# Patient Record
Sex: Female | Born: 2000
Health system: Southern US, Community
[De-identification: ages and names within clinical notes are randomized; demographics above are authoritative.]

## PROBLEM LIST (undated history)

## (undated) DIAGNOSIS — T7840XA Allergy, unspecified, initial encounter: Secondary | ICD-10-CM

## (undated) DIAGNOSIS — R7303 Prediabetes: Secondary | ICD-10-CM

## (undated) DIAGNOSIS — J45909 Unspecified asthma, uncomplicated: Secondary | ICD-10-CM

## (undated) DIAGNOSIS — E559 Vitamin D deficiency, unspecified: Secondary | ICD-10-CM

## (undated) DIAGNOSIS — E669 Obesity, unspecified: Secondary | ICD-10-CM

## (undated) DIAGNOSIS — R569 Unspecified convulsions: Secondary | ICD-10-CM

## (undated) HISTORY — DX: Unspecified asthma, uncomplicated: J45.909

## (undated) HISTORY — DX: Obesity, unspecified: E66.9

## (undated) HISTORY — DX: Allergy, unspecified, initial encounter: T78.40XA

## (undated) HISTORY — PX: TYMPANOSTOMY TUBE PLACEMENT: SHX32

## (undated) HISTORY — DX: Vitamin D deficiency, unspecified: E55.9

## (undated) HISTORY — PX: TONSILLECTOMY: SUR1361

## (undated) HISTORY — PX: ADENOIDECTOMY: SUR15

## (undated) HISTORY — DX: Unspecified convulsions: R56.9

## (undated) HISTORY — DX: Prediabetes: R73.03

---
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
## 2018-01-17 ENCOUNTER — Other Ambulatory Visit: Payer: Self-pay | Admitting: Internal Medicine

## 2018-01-17 ENCOUNTER — Encounter: Payer: BLUE CROSS/BLUE SHIELD | Admitting: Internal Medicine

## 2018-01-17 ENCOUNTER — Telehealth: Payer: Self-pay | Admitting: Internal Medicine

## 2018-01-17 MED ORDER — LISINOPRIL 10 MG PO TABS: 10.0000 mg | ORAL_TABLET | Freq: Every day | ORAL | 2 refills | Status: DC

## 2018-01-17 NOTE — Telephone Encounter (Signed)
Copied from CRM [id number]. Topic: Quick Communication - Rx Refill/Question >> Jan 17, 2018  8:49 AM Luanna Coleawoud, Jessica L wrote: Medication:lisinopril (PRINIVIL,ZESTRIL) 10 MG tablet   Has the patient contacted their pharmacy?yes  Preferred Pharmacy (with phone number or street name):  [facility] [place] [address] [phone] (Phone) [phone] (Fax)  Agent: Please be advised that RX refills may take up to 3 business days. We ask that you follow-up with your pharmacy.

## 2018-02-14 ENCOUNTER — Telehealth: Payer: Self-pay | Admitting: Internal Medicine

## 2018-02-14 NOTE — Telephone Encounter (Signed)
Pt grandmother Kennon RoundsSally called about pt wanting to change rooms at college due to some issues with roommate. Pt is getting stress out due to this. Pt was advised that if she has a doctors note pt will be able to get her room moved faster. Call grandmother at [phone]. Thank you!

## 2018-02-14 NOTE — Telephone Encounter (Signed)
Unable to provide a medical letter secondary to this not being related to a medical condition.

## 2018-02-14 NOTE — Telephone Encounter (Signed)
Is this a letter we can provide for her?

## 2018-02-16 NOTE — Telephone Encounter (Signed)
Patients grandmother is aware.

## 2018-02-25 ENCOUNTER — Other Ambulatory Visit: Payer: Self-pay | Admitting: Internal Medicine

## 2018-02-25 DIAGNOSIS — N911 Secondary amenorrhea: Secondary | ICD-10-CM

## 2018-02-25 DIAGNOSIS — E282 Polycystic ovarian syndrome: Secondary | ICD-10-CM

## 2018-03-28 ENCOUNTER — Ambulatory Visit: Payer: BLUE CROSS/BLUE SHIELD | Admitting: Internal Medicine

## 2018-03-29 ENCOUNTER — Ambulatory Visit: Payer: BLUE CROSS/BLUE SHIELD | Admitting: Internal Medicine

## 2018-03-29 ENCOUNTER — Encounter: Payer: Self-pay | Admitting: Internal Medicine

## 2018-03-29 VITALS — BP 128/82 | HR 100 | Temp 98.6°F | Resp 15 | Ht 65.0 in | Wt 269.2 lb

## 2018-03-29 DIAGNOSIS — Z6841 Body Mass Index (BMI) 40.0 and over, adult: Secondary | ICD-10-CM | POA: Diagnosis not present

## 2018-03-29 DIAGNOSIS — Z8619 Personal history of other infectious and parasitic diseases: Secondary | ICD-10-CM | POA: Diagnosis not present

## 2018-03-29 DIAGNOSIS — I10 Essential (primary) hypertension: Secondary | ICD-10-CM | POA: Diagnosis not present

## 2018-03-29 DIAGNOSIS — E282 Polycystic ovarian syndrome: Secondary | ICD-10-CM

## 2018-03-29 NOTE — Progress Notes (Signed)
Patient ID: Alison Dixon, female   DOB: 07-Oct-2000, 17 y.o.   MRN: [id number]   Subjective:    Patient ID: Alison Dixon, female    DOB: Apr 07, 2001, 26 y.o.   MRN: [id number]  HPI  Patient here for a scheduled follow up.  She is accompanied by her grandmother.  History obtained from both of them.  She reports she is doing better. Feels better.  No chest pain.  No sob.  No acid reflux. No abdominal pain. Energy better.  Handling stress.  Taking her blood pressure medication.  Blood pressure doing better.     Past Medical History:  Diagnosis Date  . Allergy   . Asthma   . Obesity   . Prediabetes   . Seizures (HCC)   . Vitamin D deficiency    Past Surgical History:  Procedure Laterality Date  . ADENOIDECTOMY    . TONSILLECTOMY    . TYMPANOSTOMY TUBE PLACEMENT    . WISDOM TOOTH EXTRACTION  2017   Family History  Problem Relation Age of Onset  . Hypertension Mother   . Hyperlipidemia Mother   . Diabetes Maternal Grandmother   . Hypertension Maternal Grandmother   . Hypertension Maternal Grandfather   . Heart disease Maternal Grandfather   . Heart disease Paternal Grandfather   . Hyperlipidemia Paternal Grandfather    Social History   Socioeconomic History  . Marital status: Single    Spouse name: Not on file  . Number of children: Not on file  . Years of education: Not on file  . Highest education level: Not on file  Occupational History  . Not on file  Social Needs  . Financial resource strain: Not on file  . Food insecurity:    Worry: Not on file    Inability: Not on file  . Transportation needs:    Medical: Not on file    Non-medical: Not on file  Tobacco Use  . Smoking status: Never Smoker  . Smokeless tobacco: Never Used  Substance and Sexual Activity  . Alcohol use: No    Alcohol/week: 0.0 standard drinks  . Drug use: No  . Sexual activity: Not on file  Lifestyle  . Physical activity:    Days per week: Not on file    Minutes per session: Not on  file  . Stress: Not on file  Relationships  . Social connections:    Talks on phone: Not on file    Gets together: Not on file    Attends religious service: Not on file    Active member of club or organization: Not on file    Attends meetings of clubs or organizations: Not on file    Relationship status: Not on file  Other Topics Concern  . Not on file  Social History Narrative  . Not on file    Outpatient Encounter Medications as of 03/29/2018  Medication Sig  . BLISOVI FE 1.5/30 1.5-30 MG-MCG tablet TAKE 1 TABLET DAILY  . lisinopril (PRINIVIL,ZESTRIL) 10 MG tablet TAKE 1 TABLET BY MOUTH DAILY  . lisinopril (PRINIVIL,ZESTRIL) 10 MG tablet Take 1 tablet (10 mg total) by mouth daily.  . Cholecalciferol (VITAMIN D3) 3000 UNITS TABS Take by mouth.   No facility-administered encounter medications on file as of 03/29/2018.     Review of Systems  Constitutional: Negative for appetite change and unexpected weight change.  HENT: Negative for congestion and sinus pressure.   Respiratory: Negative for cough, chest tightness and shortness of breath.  Cardiovascular: Negative for chest pain, palpitations and leg swelling.  Gastrointestinal: Negative for abdominal pain, diarrhea, nausea and vomiting.  Genitourinary: Negative for difficulty urinating and dysuria.  Musculoskeletal: Negative for joint swelling and myalgias.  Skin: Negative for color change and rash.  Neurological: Negative for dizziness, light-headedness and headaches.  Psychiatric/Behavioral: Negative for agitation and dysphoric mood.       Objective:    Physical Exam  Constitutional: She appears well-developed and well-nourished. No distress.  HENT:  Nose: Nose normal.  Mouth/Throat: Oropharynx is clear and moist.  Neck: Neck supple. No thyromegaly present.  Cardiovascular: Normal rate and regular rhythm.  Pulmonary/Chest: Breath sounds normal. No respiratory distress. She has no wheezes.  Abdominal: Soft. Bowel  sounds are normal. There is no tenderness.  Musculoskeletal: She exhibits no edema or tenderness.  Lymphadenopathy:    She has no cervical adenopathy.  Skin: No rash noted. No erythema.  Psychiatric: She has a normal mood and affect. Her behavior is normal.    BP 128/82   Pulse 100   Temp 98.6 F (37 C) (Oral)   Resp 15   Ht 5\' 5"  (1.651 m)   Wt 269 lb 4 oz (122.1 kg)   SpO2 98%   BMI 44.81 kg/m  Wt Readings from Last 3 Encounters:  03/29/18 269 lb 4 oz (122.1 kg) (>99 %, Z= 2.65)*  12/02/17 265 lb 6.4 oz (120.4 kg) (>99 %, Z= 2.60)*  11/22/17 264 lb 6.4 oz (119.9 kg) (>99 %, Z= 2.59)*   * Growth percentiles are based on CDC (Girls, 2-20 Years) data.     Lab Results  Component Value Date   WBC 20.4 (H) 11/04/2017   HGB 13.6 11/04/2017   HCT 41.9 11/04/2017   PLT 314 11/04/2017   GLUCOSE 86 11/03/2017   CHOL 195 11/03/2017   TRIG 326.0 (H) 11/03/2017   HDL 25.00 (L) 11/03/2017   LDLDIRECT 107.0 11/03/2017   ALT 447 (H) 11/04/2017   AST 247 (H) 11/04/2017   NA 133 (L) 11/03/2017   K 4.4 11/03/2017   CL 100 11/03/2017   CREATININE 0.62 11/03/2017   BUN 6 11/03/2017   CO2 22 11/03/2017   TSH 1.54 11/03/2017   HGBA1C 5.7 11/03/2017    US Abdomen Complete  Result Date: 11/04/2017 CLINICAL DATA:  Epigastric pain.  Abnormal liver function tests. EXAM: ABDOMEN ULTRASOUND COMPLETE COMPARISON:  No prior. FINDINGS: Gallbladder: No gallstones or wall thickening visualized. No sonographic Murphy sign noted by sonographer. Common bile duct: Diameter: 5.7 mm Liver: No focal lesion identified. Within normal limits in parenchymal echogenicity. Portal vein is patent on color Doppler imaging with normal direction of blood flow towards the liver. IVC: No abnormality visualized. Pancreas: Visualized portion unremarkable. Spleen: Spleen is enlarged at 14.3 cm with a volume of 858 cc. Right Kidney: Length: 13.9 cm. Echogenicity within normal limits. No mass or hydronephrosis visualized.  Left Kidney: Length: 14.0 cm. Echogenicity within normal limits. No mass or hydronephrosis visualized. Abdominal aorta: No aneurysm visualized. Other findings: None. IMPRESSION: 1. No gallstones or biliary distention. No focal hepatic abnormality. 2.  Spleen is enlarged at 14.3 cm with a volume of 8 58 cc. Electronically Signed   By: Maisie Fus  Register   On: 11/04/2017 09:17       Assessment & Plan:   Problem List Items Addressed This Visit    BMI 40.0-44.9, adult (HCC)    Discussed diet and exercise.  Follow.        Essential hypertension  Blood pressure on recheck improved and wnl.  Have her spot check her pressure.  Continue lisinopril.  Follow pressures.  Follow metabolic panel.        PCOS (polycystic ovarian syndrome)    On ocp's.  Periods regular.         Other Visit Diagnoses    History of mononucleosis    -  Primary   Liver function tests now wnl.  Energy better.  Follow.         Dale [place], MD

## 2018-04-03 ENCOUNTER — Encounter: Payer: Self-pay | Admitting: Internal Medicine

## 2018-04-03 NOTE — Assessment & Plan Note (Signed)
Blood pressure on recheck improved and wnl.  Have her spot check her pressure.  Continue lisinopril.  Follow pressures.  Follow metabolic panel.

## 2018-04-03 NOTE — Assessment & Plan Note (Signed)
On ocp's.  Periods regular.   

## 2018-04-03 NOTE — Assessment & Plan Note (Signed)
Discussed diet and exercise.  Follow.  

## 2018-04-18 ENCOUNTER — Other Ambulatory Visit: Payer: Self-pay

## 2018-04-19 ENCOUNTER — Telehealth: Payer: Self-pay | Admitting: Internal Medicine

## 2018-04-19 ENCOUNTER — Other Ambulatory Visit: Payer: Self-pay

## 2018-04-19 MED ORDER — LISINOPRIL 10 MG PO TABS: 10.0000 mg | ORAL_TABLET | Freq: Every day | ORAL | 1 refills | Status: DC

## 2018-04-19 NOTE — Telephone Encounter (Signed)
Copied from CRM [id number]. Topic: General - Other >> Apr 19, 2018 11:34 AM Marylen Ponto wrote: Reason for CRM: Pt requests to have the Rx for lisinopril (PRINIVIL,ZESTRIL) 10 MG tablet changed to a 90 day supply because she is being charged more to have the medication delivered monthly through the mail order service. Cb# [phone]

## 2018-04-19 NOTE — Telephone Encounter (Signed)
rx sent in. Patient aware

## 2018-07-11 DIAGNOSIS — H6501 Acute serous otitis media, right ear: Secondary | ICD-10-CM | POA: Diagnosis not present

## 2018-07-11 DIAGNOSIS — J0100 Acute maxillary sinusitis, unspecified: Secondary | ICD-10-CM | POA: Diagnosis not present

## 2018-10-22 ENCOUNTER — Other Ambulatory Visit: Payer: Self-pay | Admitting: Internal Medicine

## 2018-11-08 ENCOUNTER — Ambulatory Visit: Payer: BLUE CROSS/BLUE SHIELD | Admitting: Internal Medicine

## 2019-01-20 ENCOUNTER — Other Ambulatory Visit: Payer: Self-pay | Admitting: Internal Medicine

## 2019-02-10 NOTE — Telephone Encounter (Signed)
err

## 2019-03-27 ENCOUNTER — Telehealth: Payer: Self-pay | Admitting: Internal Medicine

## 2019-03-27 NOTE — Telephone Encounter (Signed)
Medication Refill - Medication:  lisinopril (ZESTRIL) 10 MG tablet  Has the patient contacted their pharmacy? Yes advised to call office. Pt left script at her house and has none.   Preferred Pharmacy (with phone number or street name):  [facility]  [address] [place], [place] [address]  Agent: Please be advised that RX refills may take up to 3 business days. We ask that you follow-up with your pharmacy.

## 2019-04-04 MED ORDER — LISINOPRIL 10 MG PO TABS: 10.0000 mg | ORAL_TABLET | Freq: Every day | ORAL | 0 refills | Status: DC

## 2019-04-04 NOTE — Telephone Encounter (Signed)
Pt calling to check on this.  She is away at school and can't get in to the office.  Wants to know if this can be refilled since she hasn't had in over a week and a visit set up for a later date.  Tried office, no answer. Pt can be reached at [phone]

## 2019-04-04 NOTE — Addendum Note (Signed)
Addended by: Nash Mantis F on: 04/04/2019 04:38 PM   Modules accepted: Orders

## 2019-04-04 NOTE — Telephone Encounter (Signed)
Last Refill:  01/20/19  Last OV:    03/29/18  Patient called stating she has been without her Lisinopril meds for 1 week because she left it at home.  Patient is away at college.  Patient informed that she would need to schedule an appt to continue to get refills since it has been over a year since her last visit.  Patient scheduled a virtual visit on 04/11/19.  [facility] [place] [facility] supply.

## 2019-04-11 ENCOUNTER — Ambulatory Visit (INDEPENDENT_AMBULATORY_CARE_PROVIDER_SITE_OTHER): Payer: BC Managed Care – PPO | Admitting: Internal Medicine

## 2019-04-11 ENCOUNTER — Other Ambulatory Visit: Payer: Self-pay

## 2019-04-11 DIAGNOSIS — D72820 Lymphocytosis (symptomatic): Secondary | ICD-10-CM | POA: Diagnosis not present

## 2019-04-11 DIAGNOSIS — E282 Polycystic ovarian syndrome: Secondary | ICD-10-CM

## 2019-04-11 DIAGNOSIS — E785 Hyperlipidemia, unspecified: Secondary | ICD-10-CM

## 2019-04-11 DIAGNOSIS — I10 Essential (primary) hypertension: Secondary | ICD-10-CM

## 2019-04-11 NOTE — Progress Notes (Signed)
Patient ID: Alison Dixon, female   DOB: 03/29/2001, 18 y.o.   MRN: [id number]   Virtual Visit via video Note  This visit type was conducted due to national recommendations for restrictions regarding the COVID-19 pandemic (e.g. social distancing).  This format is felt to be most appropriate for this patient at this time.  All issues noted in this document were discussed and addressed.  No physical exam was performed (except for noted visual exam findings with Video Visits).   I connected with Alison Dixon by a video enabled telemedicine application  and verified that I am speaking with the correct person using two identifiers. Location patient: home Location provider: work Persons participating in the virtual visit: patient, provider  I discussed the limitations, risks, security and privacy concerns of performing an evaluation and management service by video and the availability of in person appointments. The patient expressed understanding and agreed to proceed.   Reason for visit: scheduled follow up.   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  Handling stress.  Trying to stay active.  No chest pain. No sob.  No acid reflux.  No abdominal pain.  Bowels moving.  Taking her blood pressure medication.  Reports her blood pressure has been wnl.  In a relationship.  Not sexually active.     ROS: See pertinent positives and negatives per HPI.  Past Medical History:  Diagnosis Date  . Allergy   . Asthma   . Obesity   . Prediabetes   . Seizures (HCC)   . Vitamin D deficiency     Past Surgical History:  Procedure Laterality Date  . ADENOIDECTOMY    . TONSILLECTOMY    . TYMPANOSTOMY TUBE PLACEMENT    . WISDOM TOOTH EXTRACTION  2017    Family History  Problem Relation Age of Onset  . Hypertension Mother   . Hyperlipidemia Mother   . Diabetes Maternal Grandmother   . Hypertension Maternal Grandmother   .  Hypertension Maternal Grandfather   . Heart disease Maternal Grandfather   . Heart disease Paternal Grandfather   . Hyperlipidemia Paternal Grandfather     SOCIAL HX: reviewed.    Current Outpatient Medications:  .  BLISOVI FE 1.5/30 1.5-30 MG-MCG tablet, TAKE 1 TABLET DAILY, Disp: 84 tablet, Rfl: 4 .  Cholecalciferol (VITAMIN D3) 3000 UNITS TABS, Take by mouth., Disp: , Rfl:  .  lisinopril (ZESTRIL) 10 MG tablet, Take 1 tablet (10 mg total) by mouth daily., Disp: 30 tablet, Rfl: 0  EXAM:  GENERAL: alert, oriented, appears well and in no acute distress  HEENT: atraumatic, conjunttiva clear, no obvious abnormalities on inspection of external nose and ears  NECK: normal movements of the head and neck  LUNGS: on inspection no signs of respiratory distress, breathing rate appears normal, no obvious gross SOB, gasping or wheezing  CV: no obvious cyanosis  PSYCH/NEURO: pleasant and cooperative, no obvious depression or anxiety, speech and thought processing grossly intact  ASSESSMENT AND PLAN:  Discussed the following assessment and plan:  Essential hypertension Per her report, blood pressure ok.  Continue current medication.  Follow pressures.  Needs labs.    PCOS (polycystic ovarian syndrome) On ocp's.  Periods regular.    Lymphocytosis Saw hematology.  Felt related to mono.  Recheck cbc.   Hyperlipidemia Low carb diet and exercise. Follow lipid panel.     I discussed the assessment and treatment plan with the patient.  The patient was provided an opportunity to ask questions and all were answered. The patient agreed with the plan and demonstrated an understanding of the instructions.   The patient was advised to call back or seek an in-person evaluation if the symptoms worsen or if the condition fails to improve as anticipated.   Einar Pheasant, MD

## 2019-04-16 ENCOUNTER — Encounter: Payer: Self-pay | Admitting: Internal Medicine

## 2019-04-16 DIAGNOSIS — E785 Hyperlipidemia, unspecified: Secondary | ICD-10-CM | POA: Insufficient documentation

## 2019-04-16 NOTE — Assessment & Plan Note (Signed)
Saw hematology.  Felt related to mono.  Recheck cbc.

## 2019-04-16 NOTE — Assessment & Plan Note (Signed)
On ocp's.  Periods regular.

## 2019-04-16 NOTE — Assessment & Plan Note (Signed)
Low carb diet and exercise.  Follow lipid panel.  

## 2019-04-16 NOTE — Assessment & Plan Note (Signed)
Per her report, blood pressure ok.  Continue current medication.  Follow pressures.  Needs labs.

## 2019-04-20 ENCOUNTER — Other Ambulatory Visit: Payer: Self-pay | Admitting: Internal Medicine

## 2019-04-21 ENCOUNTER — Other Ambulatory Visit: Payer: Self-pay | Admitting: Internal Medicine

## 2019-04-21 DIAGNOSIS — E282 Polycystic ovarian syndrome: Secondary | ICD-10-CM

## 2019-04-21 DIAGNOSIS — N911 Secondary amenorrhea: Secondary | ICD-10-CM

## 2019-04-24 ENCOUNTER — Ambulatory Visit: Payer: BLUE CROSS/BLUE SHIELD | Admitting: Internal Medicine

## 2019-04-24 ENCOUNTER — Other Ambulatory Visit: Payer: Self-pay

## 2019-04-24 ENCOUNTER — Telehealth: Payer: Self-pay | Admitting: Internal Medicine

## 2019-04-24 DIAGNOSIS — N911 Secondary amenorrhea: Secondary | ICD-10-CM

## 2019-04-24 DIAGNOSIS — E282 Polycystic ovarian syndrome: Secondary | ICD-10-CM

## 2019-04-24 MED ORDER — NORETHIN ACE-ETH ESTRAD-FE 1.5-30 MG-MCG PO TABS: 1.0000 | ORAL_TABLET | Freq: Every day | ORAL | 4 refills | Status: DC

## 2019-04-24 NOTE — Telephone Encounter (Signed)
Prescriptions sent in

## 2019-04-24 NOTE — Telephone Encounter (Signed)
Pt called in for refill for BLISOVI FE 1.5/30 1.5-30 MG-MCG tablet and also lisinopril (ZESTRIL) 10 MG tablet       Pharmacy:  [facility], [place] [phone] (Phone) [phone] (Fax)

## 2019-04-27 ENCOUNTER — Other Ambulatory Visit: Payer: Self-pay | Admitting: Internal Medicine

## 2019-05-23 ENCOUNTER — Other Ambulatory Visit: Payer: BC Managed Care – PPO

## 2019-06-20 ENCOUNTER — Other Ambulatory Visit: Payer: BC Managed Care – PPO

## 2019-09-14 IMAGING — US US ABDOMEN COMPLETE
1 series · 14 of 25 positions shown · non-contrast
Comparison: No prior.

CLINICAL DATA: Epigastric pain.  Abnormal liver function tests.

EXAM:
ABDOMEN ULTRASOUND COMPLETE

[Series 1: us abdomen complete · 14 of 115 slices shown]
[im 1/115]
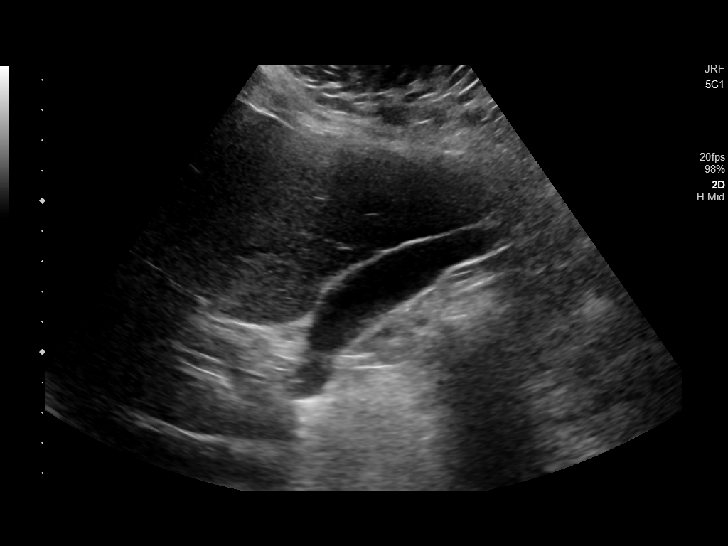
[im 10/115]
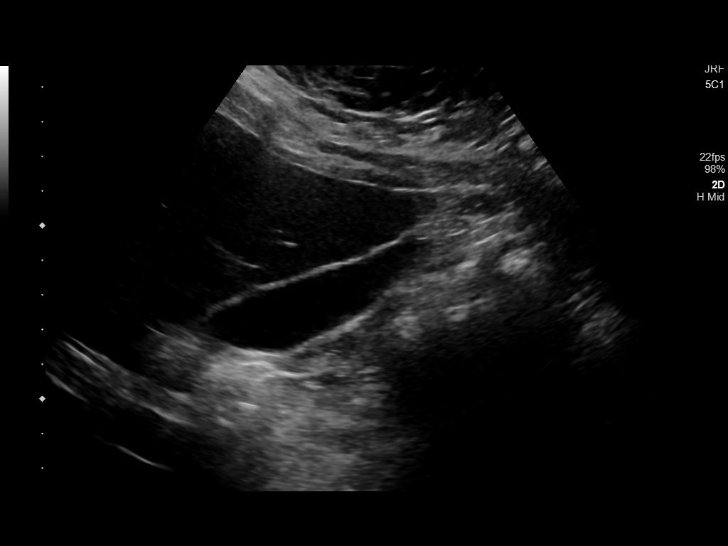
[im 20/115]
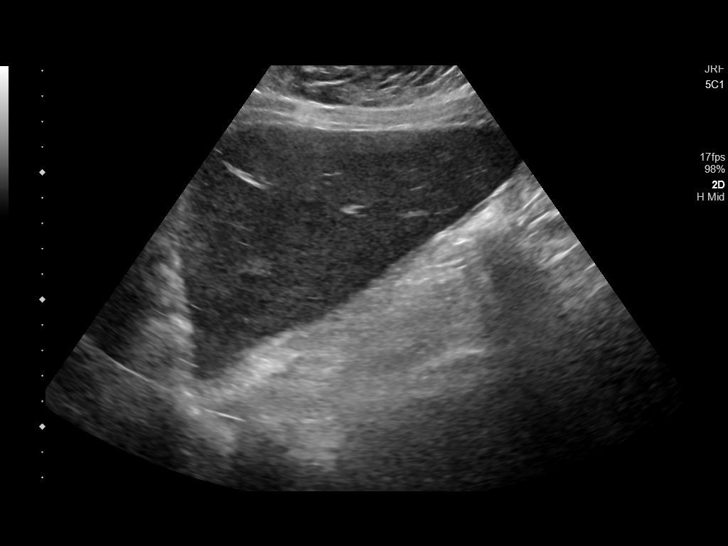
[im 29/115]
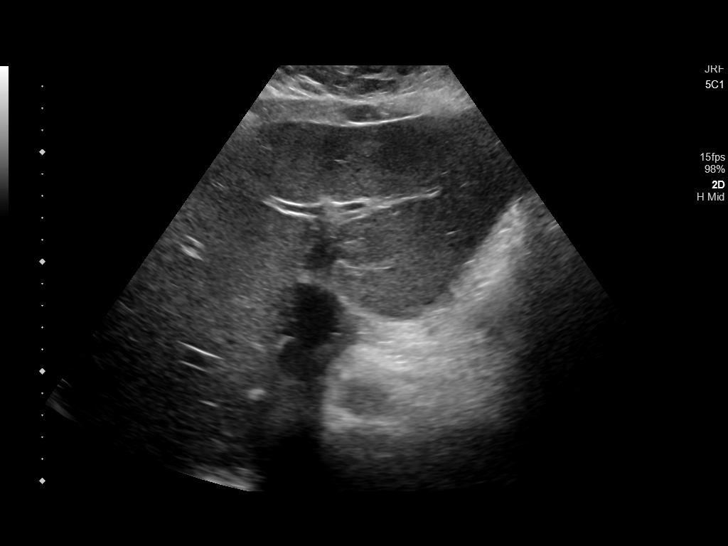
[im 39/115]
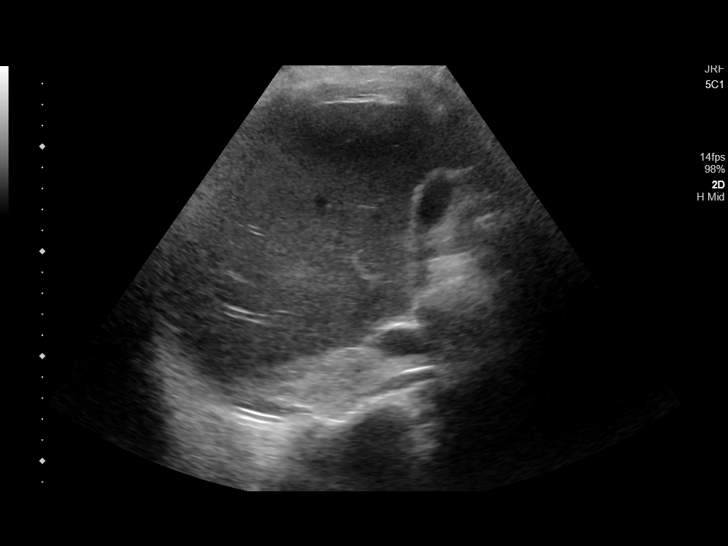
[im 43/115]
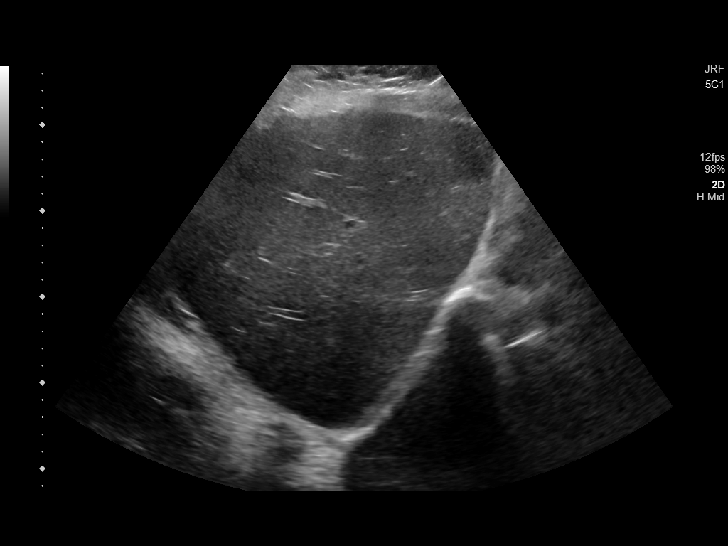
[im 53/115]
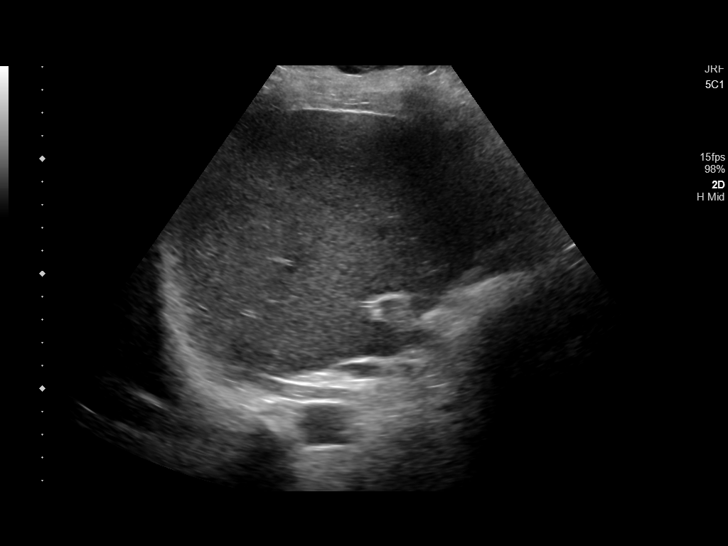
[im 62/115]
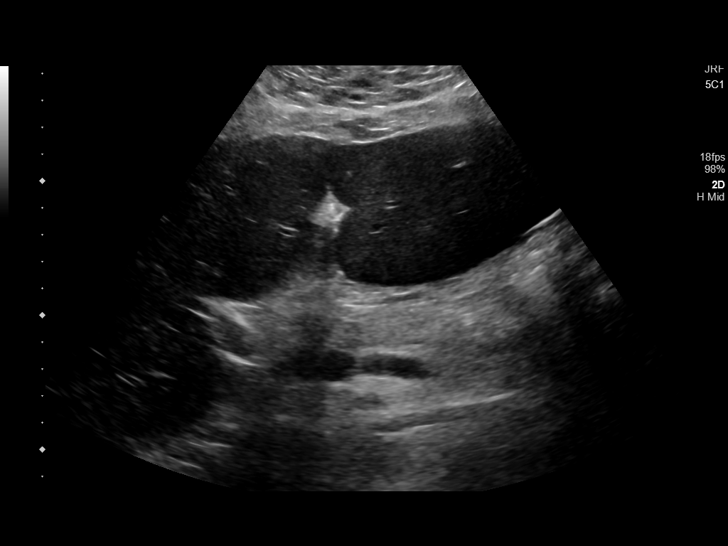
[im 72/115]
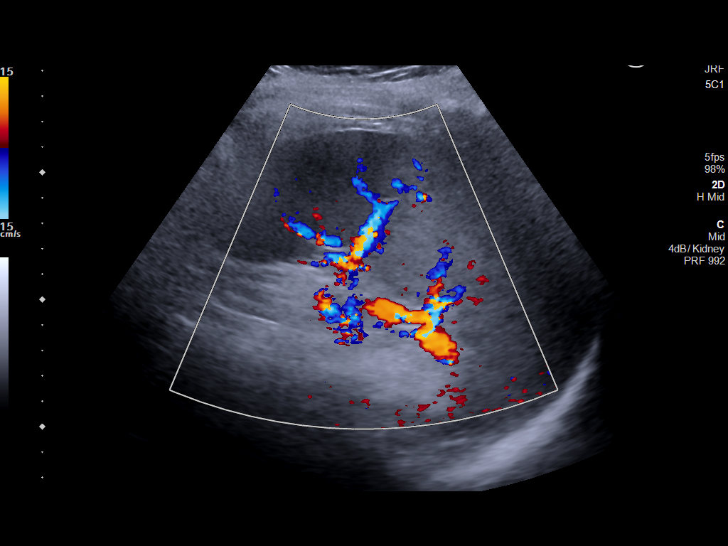
[im 77/115]
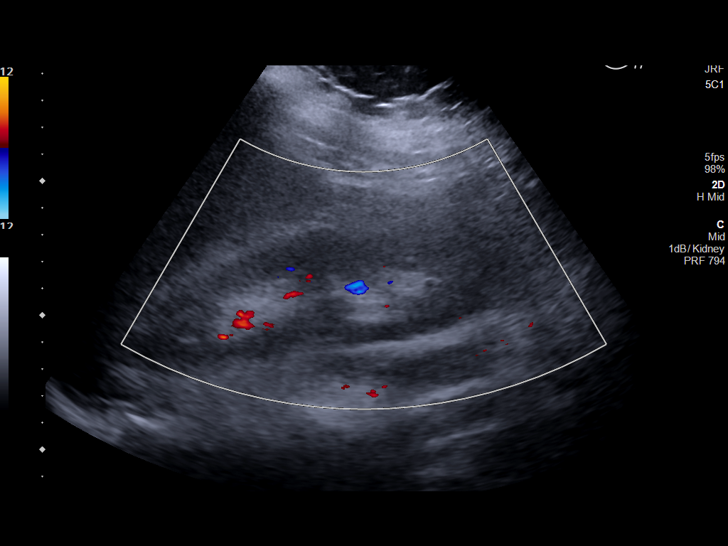
[im 86/115]
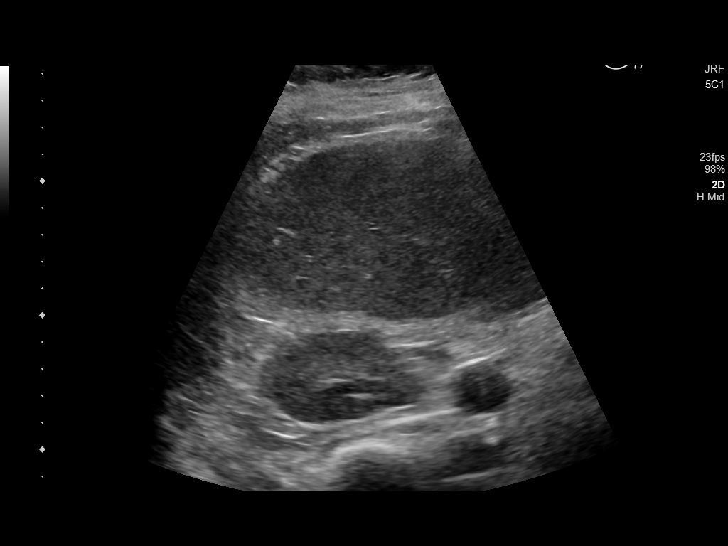
[im 96/115]
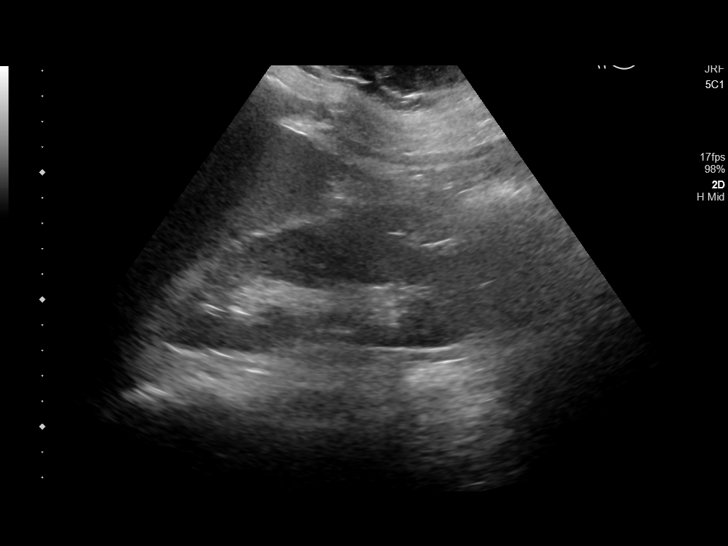
[im 105/115]
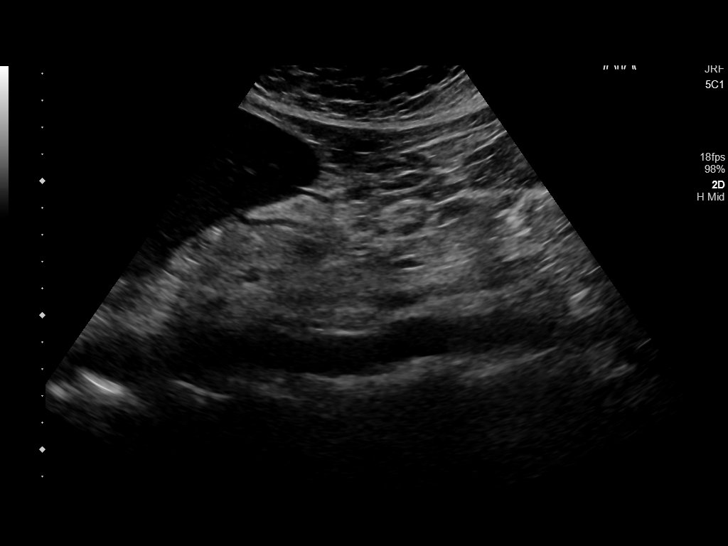
[im 115/115]
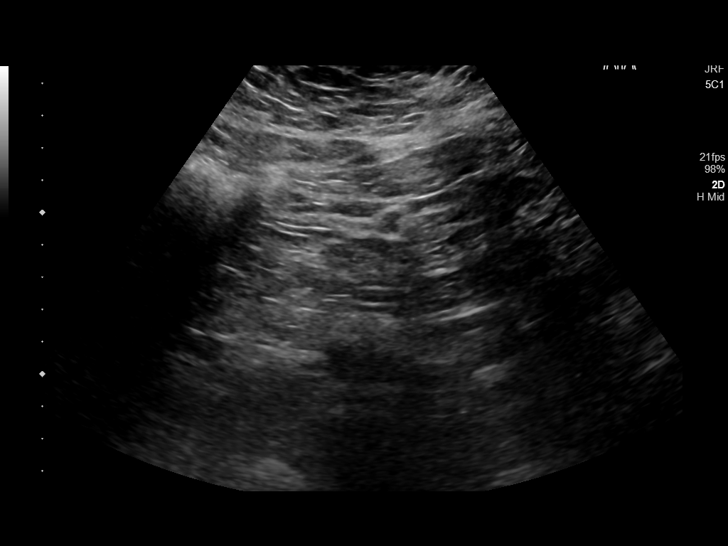

[14 of 25 positions shown; findings below may reference images not displayed]

FINDINGS: Gallbladder: No gallstones or wall thickening visualized. No
sonographic Murphy sign noted by sonographer.

Common bile duct: Diameter: 5.7 mm

Liver: No focal lesion identified. Within normal limits in
parenchymal echogenicity. Portal vein is patent on color Doppler
imaging with normal direction of blood flow towards the liver.

IVC: No abnormality visualized.

Pancreas: Visualized portion unremarkable.

Spleen: Spleen is enlarged at 14.3 cm with a volume of 858 cc.

Right Kidney: Length: 13.9 cm. Echogenicity within normal limits. No
mass or hydronephrosis visualized.

Left Kidney: Length: 14.0 cm. Echogenicity within normal limits. No
mass or hydronephrosis visualized.

Abdominal aorta: No aneurysm visualized.

Other findings: None.
IMPRESSION: 1. No gallstones or biliary distention. No focal hepatic
abnormality.

2.  Spleen is enlarged at 14.3 cm with a volume of 8 58 cc.

## 2019-10-16 ENCOUNTER — Telehealth: Payer: Self-pay | Admitting: Internal Medicine

## 2019-10-16 DIAGNOSIS — N911 Secondary amenorrhea: Secondary | ICD-10-CM

## 2019-10-16 DIAGNOSIS — E282 Polycystic ovarian syndrome: Secondary | ICD-10-CM

## 2019-10-16 MED ORDER — LISINOPRIL 10 MG PO TABS: 10.0000 mg | ORAL_TABLET | Freq: Every day | ORAL | 0 refills | Status: DC

## 2019-10-16 MED ORDER — NORETHIN ACE-ETH ESTRAD-FE 1.5-30 MG-MCG PO TABS: 1.0000 | ORAL_TABLET | Freq: Every day | ORAL | 4 refills | Status: DC

## 2019-10-16 NOTE — Telephone Encounter (Signed)
[facility] to [facility] to get lisinopril and blisovi prescriptions filled.

## 2019-11-13 ENCOUNTER — Other Ambulatory Visit: Payer: Self-pay | Admitting: Internal Medicine

## 2020-07-22 ENCOUNTER — Encounter: Payer: Self-pay | Admitting: *Deleted

## 2020-10-30 ENCOUNTER — Other Ambulatory Visit: Payer: Self-pay | Admitting: Internal Medicine

## 2020-10-30 DIAGNOSIS — N911 Secondary amenorrhea: Secondary | ICD-10-CM

## 2020-10-30 DIAGNOSIS — E282 Polycystic ovarian syndrome: Secondary | ICD-10-CM

## 2020-10-30 NOTE — Telephone Encounter (Signed)
In reviewing chart, this was refilled 09/2019 for one year.  I have not seen her since 2020.  She needs a f/u appt with me asap.  I need to know how her blood pressures are doing.  Is she taking blood pressure medication.  Has she been taking pills regularly?  Missed any?  Seeing anyone else since has been 2 years since seen?

## 2020-10-31 NOTE — Telephone Encounter (Signed)
Patient has not been taking BP medication for a few months now. Has not been checking BP. Has not seen any other providers since last appt here. Advised in order to get anymore refills she will need to come in for a face to face visit to have BP checked, labs, etc. Offered work in appt. Pt stated she would have to figure out when she can come in and call back to schedule.

## 2020-10-31 NOTE — Telephone Encounter (Signed)
Duplicate

## 2020-10-31 NOTE — Telephone Encounter (Signed)
See other note. Duplicate.

## 2022-03-03 ENCOUNTER — Emergency Department
Admission: EM | Admit: 2022-03-03 | Discharge: 2022-03-03 | Disposition: A | Discharge status: Home / Self Care | Payer: BC Managed Care – PPO | Attending: Emergency Medicine | Admitting: Emergency Medicine

## 2022-03-03 ENCOUNTER — Ambulatory Visit
Admission: EM | Admit: 2022-03-03 | Discharge: 2022-03-03 | Disposition: A | Discharge status: Home / Self Care | Payer: BC Managed Care – PPO | Attending: Family Medicine | Admitting: Family Medicine

## 2022-03-03 ENCOUNTER — Other Ambulatory Visit: Payer: Self-pay

## 2022-03-03 DIAGNOSIS — I160 Hypertensive urgency: Secondary | ICD-10-CM | POA: Diagnosis not present

## 2022-03-03 DIAGNOSIS — J45909 Unspecified asthma, uncomplicated: Secondary | ICD-10-CM | POA: Diagnosis not present

## 2022-03-03 DIAGNOSIS — Z20822 Contact with and (suspected) exposure to covid-19: Secondary | ICD-10-CM | POA: Insufficient documentation

## 2022-03-03 DIAGNOSIS — L292 Pruritus vulvae: Secondary | ICD-10-CM | POA: Diagnosis not present

## 2022-03-03 DIAGNOSIS — R739 Hyperglycemia, unspecified: Secondary | ICD-10-CM | POA: Diagnosis not present

## 2022-03-03 DIAGNOSIS — R000 Tachycardia, unspecified: Secondary | ICD-10-CM

## 2022-03-03 DIAGNOSIS — I10 Essential (primary) hypertension: Secondary | ICD-10-CM | POA: Diagnosis present

## 2022-03-03 LAB — COMPREHENSIVE METABOLIC PANEL
ALT: 27 U/L (ref 0–44)
AST: 21 U/L (ref 15–41)
Albumin: 4.1 g/dL (ref 3.5–5.0)
Alkaline Phosphatase: 94 U/L (ref 38–126)
Anion gap: 13 (ref 5–15)
BUN: 6 mg/dL (ref 6–20)
CO2: 21 mmol/L — ABNORMAL LOW (ref 22–32)
Calcium: 9.4 mg/dL (ref 8.9–10.3)
Chloride: 100 mmol/L (ref 98–111)
Creatinine, Ser: 0.53 mg/dL (ref 0.44–1.00)
GFR, Estimated: >60 mL/min (ref >60)
Glucose, Bld: 314 mg/dL — ABNORMAL HIGH (ref 70–99)
Potassium: 3.4 mmol/L — ABNORMAL LOW (ref 3.5–5.1)
Sodium: 134 mmol/L — ABNORMAL LOW (ref 135–145)
Total Bilirubin: 0.7 mg/dL (ref 0.3–1.2)
Total Protein: 8.5 g/dL — ABNORMAL HIGH (ref 6.5–8.1)

## 2022-03-03 LAB — CBC WITH DIFFERENTIAL/PLATELET
Abs Immature Granulocytes: 0.1 10*3/uL — ABNORMAL HIGH (ref 0.00–0.07)
Basophils Absolute: 0.1 10*3/uL (ref 0.0–0.1)
Basophils Relative: 1 %
Eosinophils Absolute: 0.1 10*3/uL (ref 0.0–0.5)
Eosinophils Relative: 0 %
HCT: 45.4 % (ref 36.0–46.0)
Hemoglobin: 14.4 g/dL (ref 12.0–15.0)
Immature Granulocytes: 1 %
Lymphocytes Relative: 21 %
Lymphs Abs: 3.1 10*3/uL (ref 0.7–4.0)
MCH: 24 pg — ABNORMAL LOW (ref 26.0–34.0)
MCHC: 31.7 g/dL (ref 30.0–36.0)
MCV: 75.7 fL — ABNORMAL LOW (ref 80.0–100.0)
Monocytes Absolute: 0.8 10*3/uL (ref 0.1–1.0)
Monocytes Relative: 5 %
Neutro Abs: 10.5 10*3/uL — ABNORMAL HIGH (ref 1.7–7.7)
Neutrophils Relative %: 72 %
Platelets: 372 10*3/uL (ref 150–400)
RBC: 6 MIL/uL — ABNORMAL HIGH (ref 3.87–5.11)
RDW: 14.7 % (ref 11.5–15.5)
WBC: 14.6 10*3/uL — ABNORMAL HIGH (ref 4.0–10.5)
nRBC: 0 % (ref 0.0–0.2)

## 2022-03-03 LAB — T4, FREE: Free T4: 1.11 ng/dL (ref 0.61–1.12)

## 2022-03-03 LAB — CBG MONITORING, ED: Glucose-Capillary: 286 mg/dL — ABNORMAL HIGH (ref 70–99)

## 2022-03-03 LAB — SARS CORONAVIRUS 2 BY RT PCR: SARS Coronavirus 2 by RT PCR: NEGATIVE

## 2022-03-03 LAB — POC URINE PREG, ED: Preg Test, Ur: NEGATIVE

## 2022-03-03 LAB — TSH: TSH: 1.301 u[IU]/mL (ref 0.350–4.500)

## 2022-03-03 MED ORDER — LACTATED RINGERS IV BOLUS
1000.0000 mL | Freq: Once | INTRAVENOUS | Status: AC
  Administered 2022-03-03: 1000 mL via INTRAVENOUS

## 2022-03-03 MED ORDER — LISINOPRIL 10 MG PO TABS: 10.0000 mg | ORAL_TABLET | Freq: Every day | ORAL | 11 refills | Status: DC

## 2022-03-03 MED ORDER — LISINOPRIL 10 MG PO TABS
10.0000 mg | ORAL_TABLET | Freq: Once | ORAL | Status: AC
  Administered 2022-03-03: 10 mg via ORAL
  Filled 2022-03-03: qty 1

## 2022-03-03 MED ORDER — METFORMIN HCL 500 MG PO TABS: 500.0000 mg | ORAL_TABLET | Freq: Two times a day (BID) | ORAL | 11 refills | Status: DC

## 2022-03-03 NOTE — ED Provider Notes (Signed)
[facility]    CSN: [id number] Arrival date & time: 03/03/22  1202      History   Chief Complaint Chief Complaint  Patient presents with   Hypertension    HPI Alison Dixon is a 21 y.o. female.   HPI  Alison Dixon presents for elevated blood pressure at the doctors office today.  Reports systolic blood pressures in the 190s at the gynecology office.  She use to be on blood pressure medication,, lisinopril, but states they took her off it.  Denies chest pain, palpitations, lower extremity edema, exertional dyspnea, lightheadedness, headaches and vision changes.   Of note, patient reports history of whitecoat hypertension and is anxious.        Past Medical History:  Diagnosis Date   Allergy    Asthma    Obesity    Prediabetes    Seizures (HCC)    Vitamin D deficiency     Patient Active Problem List   Diagnosis Date Noted   Hyperlipidemia 04/16/2019   Lymphocytosis 11/22/2017   Sinus congestion 11/04/2017   Abdominal pain 11/04/2017   Light headedness 01/27/2017   Secondary amenorrhea 03/26/2015   Hirsutism 09/21/2014   PCOS (polycystic ovarian syndrome) 09/21/2014   Essential hypertension 08/03/2014   Prediabetes 06/28/2014   BMI 40.0-44.9, adult (HCC) 06/28/2014   Vitamin D deficiency 06/28/2014   Acanthosis 11/30/2012   Hyperglycemia 11/30/2012   Hyperinsulinism 11/30/2012    Past Surgical History:  Procedure Laterality Date   ADENOIDECTOMY     TONSILLECTOMY     TYMPANOSTOMY TUBE PLACEMENT     WISDOM TOOTH EXTRACTION  2017    OB History   No obstetric history on file.      Home Medications    Prior to Admission medications   Medication Sig Start Date End Date Taking? Authorizing Provider  LORATADINE ALLERGY RELIEF PO Take by mouth.   Yes [provider]  Lysine 1000 MG TABS Take by mouth.   Yes [provider]  Zinc 50 MG TABS Take by mouth.   Yes [provider]  Cholecalciferol (VITAMIN D3) 3000  UNITS TABS Take by mouth.    [provider]  lisinopril (ZESTRIL) 10 MG tablet TAKE 1 TABLET BY MOUTH EVERY DAY 11/13/19   Dale [place], MD  norethindrone-ethinyl estradiol-iron (BLISOVI FE 1.5/30) 1.5-30 MG-MCG tablet Take 1 tablet by mouth daily. 10/16/19   Dale , MD    Family History Family History  Problem Relation Age of Onset   Hypertension Mother    Hyperlipidemia Mother    Diabetes Maternal Grandmother    Hypertension Maternal Grandmother    Hypertension Maternal Grandfather    Heart disease Maternal Grandfather    Heart disease Paternal Grandfather    Hyperlipidemia Paternal Grandfather     Social History Social History   Tobacco Use   Smoking status: Never   Smokeless tobacco: Never  Substance Use Topics   Alcohol use: No    Alcohol/week: 0.0 standard drinks of alcohol   Drug use: No     Allergies   Albuterol sulfate, Azithromycin, and Diphenhydramine hcl   Review of Systems Review of Systems : negative unless otherwise stated in HPI.      Physical Exam Triage Vital Signs ED Triage Vitals [03/03/22 1229]  Enc Vitals Group     BP (!) 179/131     Pulse Rate (!) 137     Resp 18     Temp 98.4 F (36.9 C)     Temp  Source Oral     SpO2 100 %     Weight      Height      Head Circumference      Peak Flow      Pain Score      Pain Loc      Pain Edu?      Excl. in GC?    No data found.  Updated Vital Signs BP (!) 224/122 (BP Location: Left Arm)   Pulse (!) 132   Temp 98.4 F (36.9 C) (Oral)   Resp 18   LMP 02/02/2022   SpO2 100%   Visual Acuity Right Eye Distance:   Left Eye Distance:   Bilateral Distance:    Right Eye Near:   Left Eye Near:    Bilateral Near:     Physical Exam  GEN: alert, anxious female   HENT:  mucus membranes moist, no nasal discharge  EYES:   pupils equal and reactive, EOM intact NECK:  supple, normal ROM RESP:  clear to auscultation bilaterally, no increased work of breathing  CVS:    regular rate and rhythm, no murmur, distal pulses intact   EXT:   normal ROM, atraumatic, no edema  NEURO:  normal without focal findings,  speech normal, alert and oriented   Skin:   warm and dry Psych:  tearful, anxious, appropriate speech and behavior    UC Treatments / Results  Labs (all labs ordered are listed, but only abnormal results are displayed) Labs Reviewed - No data to display  EKG   Radiology No results found.  Procedures Procedures (including critical care time)  Medications Ordered in UC Medications - No data to display  Initial Impression / Assessment and Plan / UC Course  I have reviewed the triage vital signs and the nursing notes.  Pertinent labs & imaging results that were available during my care of the patient were reviewed by me and considered in my medical decision making (see chart for details).     Patient is a 21 year old female with history of hypertension, hyperlipidemia and PCOS who presents for elevated blood pressures at her gynecologist office today.  She has systolic blood pressures in the 190s at the gynecologist office.  Initial blood pressures today 170s / 130s.  She was given some ice water and allowed to rest for about 10 to 15 minutes.   She tried to call family members to help her calm down but this was unsuccessful.  BP was rechecked and unfortunately went up to the 220s over 120s.  She remains tachycardic to 130s.    On chart review, she has had hypertension for at least since 2016.  She was previously prescribed lisinopril 10 mg.  I do not see in her chart where this was discontinued.    She remains hypertensive and tachycardic.  She needs ED evaluation.  Advised to go to the emergency department.  Declines EMS and will go by private vehicle.  She is on the phone with family members to meet her there.   Final Clinical Impressions(s) / UC Diagnoses   Final diagnoses:  Hypertensive urgency  Tachycardia     Discharge  Instructions      You have been advised to follow up immediately in the emergency department for concerning signs or symptoms as discussed during your visit. If you declined EMS transport, please have a family member take you directly to the ED at this time. Do not delay.   Based on concerns about  condition, if you do not follow up in the ED, you may risk poor outcomes including worsening of condition, delayed treatment and potentially life threatening issues. If you have declined to go to the ED at this time, you should call your PCP immediately to set up a follow up appointment.     ED Prescriptions   None    PDMP not reviewed this encounter.   Katha Cabal, DO 03/03/22 1324

## 2022-03-03 NOTE — ED Provider Notes (Signed)
[facility] Provider Note    Event Date/Time   First MD Initiated Contact with Patient 03/03/22 1508     (approximate)   History   Hypertension   HPI  Alison Dixon is a 21 y.o. female  with pmh seizures, preDM, asthma who presents with elevated BP.  Patient went to her gynecologist today because she was having some vaginal itching.  Noted to have elevated blood pressure in the office in the told her to go to urgent care.  Urgent care then told her to come to the emergency department.  She does have a history of hypertension was prescribed lisinopril in the past been off of it for several years since she went to college.  Patient is essentially asymptomatic she denies headache denies visual change denies numbness tingling weakness chest pain shortness of breath abdominal pain vomiting.  Denies recent illnesses no fevers chills.  Patient tells me that her heart rate is typically in the upper 90s.     Past Medical History:  Diagnosis Date   Allergy    Asthma    Obesity    Prediabetes    Seizures (HCC)    Vitamin D deficiency     Patient Active Problem List   Diagnosis Date Noted   Hyperlipidemia 04/16/2019   Lymphocytosis 11/22/2017   Sinus congestion 11/04/2017   Abdominal pain 11/04/2017   Light headedness 01/27/2017   Secondary amenorrhea 03/26/2015   Hirsutism 09/21/2014   PCOS (polycystic ovarian syndrome) 09/21/2014   Essential hypertension 08/03/2014   Prediabetes 06/28/2014   BMI 40.0-44.9, adult (HCC) 06/28/2014   Vitamin D deficiency 06/28/2014   Acanthosis 11/30/2012   Hyperglycemia 11/30/2012   Hyperinsulinism 11/30/2012     Physical Exam  Triage Vital Signs: ED Triage Vitals [03/03/22 1421]  Enc Vitals Group     BP (!) 188/126     Pulse Rate (!) 130     Resp 16     Temp 98 F (36.7 C)     Temp src      SpO2 100 %     Weight 270 lb (122.5 kg)     Height      Head Circumference      Peak Flow      Pain Score 0      Pain Loc      Pain Edu?      Excl. in GC?     Most recent vital signs: Vitals:   03/03/22 1930 03/03/22 1948  BP: (!) 186/108   Pulse: (!) 111 (!) 106  Resp: 19   Temp:    SpO2: 99%      General: Awake, no distress.  Tachycardic, regular rhythm CV:  Good peripheral perfusion.  Resp:  Normal effort.  Lungs are clear Abd:  No distention.  Neuro:             Awake, Alert, Oriented x 3  Other:     ED Results / Procedures / Treatments  Labs (all labs ordered are listed, but only abnormal results are displayed) Labs Reviewed  COMPREHENSIVE METABOLIC PANEL - Abnormal; Notable for the following components:      Result Value   Sodium 134 (*)    Potassium 3.4 (*)    CO2 21 (*)    Glucose, Bld 314 (*)    Total Protein 8.5 (*)    All other components within normal limits  CBC WITH DIFFERENTIAL/PLATELET - Abnormal; Notable for the following components:   WBC  14.6 (*)    RBC 6.00 (*)    MCV 75.7 (*)    MCH 24.0 (*)    Neutro Abs 10.5 (*)    Abs Immature Granulocytes 0.10 (*)    All other components within normal limits  CBG MONITORING, ED - Abnormal; Notable for the following components:   Glucose-Capillary 286 (*)    All other components within normal limits  SARS CORONAVIRUS 2 BY RT PCR  TSH  T4, FREE  CBC WITH DIFFERENTIAL/PLATELET  POC URINE PREG, ED     EKG  EKG shows sinus tachycardia with incomplete right bundle branch block borderline prolonged QT interval, no acute ischemic changes   RADIOLOGY    PROCEDURES:  Critical Care performed: No  Procedures  The patient is on the cardiac monitor to evaluate for evidence of arrhythmia and/or significant heart rate changes.   MEDICATIONS ORDERED IN ED: Medications  lactated ringers bolus 1,000 mL (0 mLs Intravenous Stopped 03/03/22 1916)  lisinopril (ZESTRIL) tablet 10 mg (10 mg Oral Given 03/03/22 1915)     IMPRESSION / MDM / ASSESSMENT AND PLAN / ED COURSE  I reviewed the triage vital signs and the  nursing notes.                              Patient's presentation is most consistent with acute presentation with potential threat to life or bodily function.  Differential diagnosis includes, but is not limited to, asymptomatic hypertension, hypertensive emergency, patient noncompliance, renal failure, hypothyroidism  The patient is a 21 year old female who has been diagnosed with hypertension the past is not on medication presents because of asymptomatic hypertension.  She was at her gynecologist office diagnosed with a yeast infection and found to be hypertensive and tachycardic referred to urgent care in urgent care referred her to the emergency department.  Patient denies symptoms at this time.  She denies headache visual change numbness tingling weakness or other neurologic symptoms.  She denies any cardiopulmonary symptoms including no chest pain shortness of breath no back pain abdominal pain.  She is tachycardic into the 120s and hypertensive 180/120.  She otherwise looks well.  Lungs are clear no peripheral edema.  Blood work including CBC and CMP was attempted to be obtained in triage but patient was difficult stick.  She would prefer no blood work if not needed.  Ultimately I suspect this is asymptomatic hypertension.  She is tachycardic though and I would like to see her heart rate improved.  We will check CBG ensure she is not significantly hyperglycemic specially given she was diagnosed with yeast infection.  Will get EKG urine Prag.  Provided p.o. hydration.  Patient tells me her heart rate was not elevated at home this could be anxiety related as patient has had a stressful day and was frustrated by having to come to the emergency department.  Patient's heart rate did improve with p.o. fluids but still mildly elevated so I did obtain labs.  She has a leukocytosis bout 15.  Blood sugar is 300 normal anion gap.  Not consistent with DKA.  Pregnancy test is negative.  EKG is nonischemic.   Patient given a liter of fluid.  Despite her leukocytosis she has no other infectious symptoms do not think this needs further work-up at this time.  We discussed elevated blood sugar.  Will start on metformin.  Encouraged PCP follow-up.  We will start her back on her lisinopril. Clinical Course  as of 03/03/22 1950  Tue Mar 03, 2022  1649 Glucose-Capillary(!): 286 [KM]  1649 Preg Test, Ur: NEGATIVE [KM]    Clinical Course User Index [KM] Georga Hacking, MD     FINAL CLINICAL IMPRESSION(S) / ED DIAGNOSES   Final diagnoses:  Hyperglycemia  Hypertension, unspecified type     Rx / DC Orders   ED Discharge Orders          Ordered    metFORMIN (GLUCOPHAGE) 500 MG tablet  2 times daily with meals        03/03/22 1950    lisinopril (ZESTRIL) 10 MG tablet  Daily        03/03/22 1950             Note:  This document was prepared using Dragon voice recognition software and may include unintentional dictation errors.   Georga Hacking, MD 03/03/22 1950

## 2022-03-03 NOTE — ED Notes (Signed)
Patient is being discharged from the Urgent Care and sent to the Emergency Department via personal vehicle . Per Rachael Darby, MD, patient is in need of higher level of care due to HTN and elevated HR. Patient is aware and verbalizes understanding of plan of care.  Vitals:   03/03/22 1229 03/03/22 1256  BP: (!) 179/131 (!) 224/122  Pulse: (!) 137 (!) 132  Resp: 18   Temp: 98.4 F (36.9 C)   SpO2: 100%

## 2022-03-03 NOTE — ED Triage Notes (Signed)
Patient presents to [facility] for HTN. States she was seen at ob-gyn visit and her BP's was in the 190 ranges. States they instructed her to come in to be started on BP meds. States she does not have a PCP, has white coat syndrome. Hx of HTN. Previously prescribed lisinopril. Last time she took lisinopril was two years ago.   Denies chest pain or changes in her vision.

## 2022-03-03 NOTE — ED Notes (Signed)
ED Provider at bedside. 

## 2022-03-03 NOTE — Discharge Instructions (Addendum)
Please start taking the metformin twice a day and lisinopril.  Please follow-up with your primary care provider or [place] [facility].  You will need to have monitoring of your blood pressure and your blood sugar.

## 2022-03-03 NOTE — ED Notes (Signed)
Provider notified of abnormal VS. Provider at bedside.

## 2022-03-03 NOTE — ED Notes (Signed)
RN attempted to draw blood work and unsuccessful at this time. Pt anxious and would prefer to wait to see provider before we try for the second time.

## 2022-03-03 NOTE — Discharge Instructions (Addendum)
You have been advised to follow up immediately in the emergency department for concerning signs or symptoms as discussed during your visit. If you declined EMS transport, please have a family member take you directly to the ED at this time. Do not delay.   Based on concerns about condition, if you do not follow up in the ED, you may risk poor outcomes including worsening of condition, delayed treatment and potentially life threatening issues. If you have declined to go to the ED at this time, you should call your PCP immediately to set up a follow up appointment.   

## 2022-03-03 NOTE — ED Triage Notes (Signed)
Pt arrives with c/o HTN. Per pt, UC sent her over here to be have BP meds prescribed. Pt has hx of HTN and uses to take lisinopril. Pt denies headache, blurry vision, and CP/SOB.

## 2022-03-03 NOTE — ED Notes (Signed)
Patient discharged at this time. Ambulated to lobby with independent and steady gait. Breathing unlabored speaking in full sentences. Verbalized understanding of all discharge, follow up, and medication teaching. Discharged homed with all belongings.   

## 2022-03-06 ENCOUNTER — Telehealth: Payer: Self-pay | Admitting: Internal Medicine

## 2022-03-06 DIAGNOSIS — R739 Hyperglycemia, unspecified: Secondary | ICD-10-CM

## 2022-03-06 DIAGNOSIS — I10 Essential (primary) hypertension: Secondary | ICD-10-CM

## 2022-03-06 NOTE — Telephone Encounter (Signed)
Order placed for referral to [facility].   

## 2022-03-12 ENCOUNTER — Telehealth: Payer: Self-pay

## 2022-03-12 NOTE — Telephone Encounter (Signed)
Spoke to patients grandmother and explained to her that Dr Nicki Reaper has a VV tomorrow at 1pm that she can speak to her. Made appt for tomorrow at 1pm for VV.

## 2022-03-12 NOTE — Telephone Encounter (Signed)
Patient's grandmother, Alison Dixon, called to state patient tested positive for covid on 03/10/2022.  Clarise Cruz states patient's symptoms started on 03/09/2022.  Clarise Cruz states patient's symptoms include sore throat, nasal congestion, a little sneezing, below normal temperature, and a little cough (non-productive).  Clarise Cruz states patient states her throat is better, but it feels strange when she swallows and she has very little coughing.  Clarise Cruz states patient is sleeping at night and eating and drinking.  Clarise Cruz states she would like to know if patient should be taking any medication.  Clarise Cruz states patient was out of town for the weekend at a concert and [employer], so she was in some crowds and she believes that may be when she was exposed.

## 2022-03-13 ENCOUNTER — Telehealth (INDEPENDENT_AMBULATORY_CARE_PROVIDER_SITE_OTHER): Payer: BC Managed Care – PPO | Admitting: Internal Medicine

## 2022-03-13 DIAGNOSIS — U071 COVID-19: Secondary | ICD-10-CM | POA: Diagnosis not present

## 2022-03-13 DIAGNOSIS — R739 Hyperglycemia, unspecified: Secondary | ICD-10-CM | POA: Diagnosis not present

## 2022-03-13 DIAGNOSIS — I10 Essential (primary) hypertension: Secondary | ICD-10-CM | POA: Diagnosis not present

## 2022-03-13 NOTE — Progress Notes (Signed)
Patient ID: Alison Dixon, female   DOB: 07-24-2000, 21 y.o.   MRN: [id number]   Virtual Visit via video Note  All issues noted in this document were discussed and addressed.  No physical exam was performed (except for noted visual exam findings with Video Visits).   I connected with Kimberlyn Maclachlan by a video enabled telemedicine application and verified that I am speaking with the correct person using two identifiers. Location patient: home Location provider: work Persons participating in the virtual visit: patient, provider  The limitations, risks, security and privacy concerns of performing an evaluation and management service by video and the availability of in person appointments have been discussed.  It has also been discussed with the patient that there may be a patient responsible charge related to this service. The patient expressed understanding and agreed to proceed.   Reason for visit: work in appt  HPI: Work in - covid positive.  Was seen gyn 03/03/22 - elevated blood pressure.  Referred to UC - ER.  Sugar also elevated.  Tachycardic.  Given IVFs.  Started on metformin and lisinopril.  States to concert one week ago.  Shopping and to flea market over the weekend.  Monday 9/18 - started with sore throat.  Clear mucus production.  No fever.  Temp 97.9.  no body aches.  Minimal cough.  No chest pain or sob.  Taking elderberry.  Did previously take robitussin.  Eating.    ROS: See pertinent positives and negatives per HPI.  Past Medical History:  Diagnosis Date   Allergy    Asthma    Obesity    Prediabetes    Seizures ([place])    Vitamin D deficiency     Past Surgical History:  Procedure Laterality Date   ADENOIDECTOMY     TONSILLECTOMY     TYMPANOSTOMY TUBE PLACEMENT     WISDOM TOOTH EXTRACTION  2017    Family History  Problem Relation Age of Onset   Hypertension Mother    Hyperlipidemia Mother    Diabetes Maternal Grandmother    Hypertension Maternal Grandmother     Hypertension Maternal Grandfather    Heart disease Maternal Grandfather    Heart disease Paternal Grandfather    Hyperlipidemia Paternal Grandfather     SOCIAL HX: reviewed.    Current Outpatient Medications:    Cholecalciferol (VITAMIN D3) 3000 UNITS TABS, Take by mouth., Disp: , Rfl:    lisinopril (ZESTRIL) 10 MG tablet, TAKE 1 TABLET BY MOUTH EVERY DAY, Disp: 30 tablet, Rfl: 0   lisinopril (ZESTRIL) 10 MG tablet, Take 1 tablet (10 mg total) by mouth daily., Disp: 30 tablet, Rfl: 11   LORATADINE ALLERGY RELIEF PO, Take by mouth., Disp: , Rfl:    Lysine 1000 MG TABS, Take by mouth., Disp: , Rfl:    metFORMIN (GLUCOPHAGE) 500 MG tablet, Take 1 tablet (500 mg total) by mouth 2 (two) times daily with a meal., Disp: 60 tablet, Rfl: 11   norethindrone-ethinyl estradiol-iron (BLISOVI FE 1.5/30) 1.5-30 MG-MCG tablet, Take 1 tablet by mouth daily., Disp: 84 tablet, Rfl: 4   Zinc 50 MG TABS, Take by mouth., Disp: , Rfl:   EXAM:  VITALS per patient if applicable: 02.5  GENERAL: alert, oriented, appears well and in no acute distress  HEENT: atraumatic, conjunttiva clear, no obvious abnormalities on inspection of external nose and ears  NECK: normal movements of the head and neck  LUNGS: on inspection no signs of respiratory distress, breathing rate appears normal, no obvious  gross SOB, gasping or wheezing  CV: no obvious cyanosis  PSYCH/NEURO: pleasant and cooperative, no obvious depression or anxiety, speech and thought processing grossly intact  ASSESSMENT AND PLAN:  Discussed the following assessment and plan:  Problem List Items Addressed This Visit     COVID-19 virus infection    Tested positive for covid as outlined.  Discussed oral anti viral medication and EUA.  Desires not to start.  Treat symptoms.  Robitussin DM for cough and congestion.  Can use saline nasal spray and steroid nasal spray as directed.  Rest. Stay hydrated.  Discussed quarantine guidelines. Discussed -   Vit C 1000 mg daily.  Vitamin D3 4000 IU daily  Zinc 50 mg daily          Essential hypertension    Blood pressure elevated on recent check.  Evaluated in ER as outlined. Just started back on lisinopril.  Follow pressures.  Follow metabolic panel.       Hyperglycemia    Started on metformin in ER.  Sugar elevated.  Low carb diet and exercise. Will need follow up regarding treatment and labs.        Return if symptoms worsen or fail to improve.   I discussed the assessment and treatment plan with the patient. The patient was provided an opportunity to ask questions and all were answered. The patient agreed with the plan and demonstrated an understanding of the instructions.   The patient was advised to call back or seek an in-person evaluation if the symptoms worsen or if the condition fails to improve as anticipated.    Dale Shelby, MD

## 2022-03-13 NOTE — Assessment & Plan Note (Addendum)
Tested positive for covid as outlined.  Discussed oral anti viral medication and EUA.  Desires not to start.  Treat symptoms.  Robitussin DM for cough and congestion.  Can use saline nasal spray and steroid nasal spray as directed.  Rest. Stay hydrated.  Discussed quarantine guidelines. Discussed -  Vit C 1000 mg daily.  Vitamin D3 4000 IU daily  Zinc 50 mg daily

## 2022-03-22 ENCOUNTER — Encounter: Payer: Self-pay | Admitting: Internal Medicine

## 2022-03-22 NOTE — Assessment & Plan Note (Signed)
Started on metformin in ER.  Sugar elevated.  Low carb diet and exercise. Will need follow up regarding treatment and labs.

## 2022-03-22 NOTE — Assessment & Plan Note (Signed)
Blood pressure elevated on recent check.  Evaluated in ER as outlined. Just started back on lisinopril.  Follow pressures.  Follow metabolic panel.

## 2022-03-31 ENCOUNTER — Encounter: Payer: Self-pay | Admitting: Internal Medicine

## 2022-03-31 ENCOUNTER — Ambulatory Visit: Payer: BC Managed Care – PPO | Admitting: Internal Medicine

## 2022-03-31 VITALS — BP 138/78 | HR 115 | Temp 98.9°F | Ht 65.0 in | Wt 257.8 lb

## 2022-03-31 DIAGNOSIS — E1165 Type 2 diabetes mellitus with hyperglycemia: Secondary | ICD-10-CM

## 2022-03-31 DIAGNOSIS — R739 Hyperglycemia, unspecified: Secondary | ICD-10-CM | POA: Diagnosis not present

## 2022-03-31 DIAGNOSIS — E785 Hyperlipidemia, unspecified: Secondary | ICD-10-CM

## 2022-03-31 DIAGNOSIS — I10 Essential (primary) hypertension: Secondary | ICD-10-CM

## 2022-03-31 LAB — POCT GLYCOSYLATED HEMOGLOBIN (HGB A1C): Hemoglobin A1C: 10.1 % — AB (ref 4.0–5.6)

## 2022-03-31 LAB — HM DIABETES FOOT EXAM

## 2022-03-31 NOTE — Progress Notes (Signed)
Patient ID: Alison Dixon, female   DOB: [birth year], 21 y.o.   MRN: [id number]   Subjective:    Patient ID: Alison Dixon, female    DOB: 12-Nov-2000, 21 y.o.   MRN: [id number]   Patient here for  Chief Complaint  Patient presents with   Follow-up    Follow up   .   HPI Here for ER follow up.  Recently evaluated in ER 03/03/22 - elevated blood pressure.  Saw gyn due to vaginal itching.  Noted to have elevated blood pressure.  Was sent to urgent care and then to ER.  Previously had been on lisinopril for her blood pressure.  Off for several years.  Was found to have elevated blood sugar 300.  Was started on metformin and restarted lisinopril.  Since her ER visit, she was diagnosed with covid.  Treated symptomatically.  Remains on vitamin C, D, zinc and lysine.  Symptoms resolved.  No cough or congestion.  No sob or chest pain.  Has adjusted diet.  No sweet tea.  Decreased carbs.  Does not want to check her sugar - does not like site of blood.  Discussed CGM.  Discussed diabetes, diet and exercise.     Past Medical History:  Diagnosis Date   Allergy    Asthma    Obesity    Prediabetes    Seizures (HCC)    Vitamin D deficiency    Past Surgical History:  Procedure Laterality Date   ADENOIDECTOMY     TONSILLECTOMY     TYMPANOSTOMY TUBE PLACEMENT     WISDOM TOOTH EXTRACTION  2017   Family History  Problem Relation Age of Onset   Hypertension Mother    Hyperlipidemia Mother    Diabetes Maternal Grandmother    Hypertension Maternal Grandmother    Hypertension Maternal Grandfather    Heart disease Maternal Grandfather    Heart disease Paternal Grandfather    Hyperlipidemia Paternal Grandfather    Social History   Socioeconomic History   Marital status: Single    Spouse name: Not on file   Number of children: Not on file   Years of education: Not on file   Highest education level: Not on file  Occupational History   Not on file  Tobacco Use   Smoking status: Never    Smokeless tobacco: Never  Substance and Sexual Activity   Alcohol use: No    Alcohol/week: 0.0 standard drinks of alcohol   Drug use: No   Sexual activity: Not on file  Other Topics Concern   Not on file  Social History Narrative   Not on file   Social Determinants of Health   Financial Resource Strain: Not on file  Food Insecurity: Not on file  Transportation Needs: Not on file  Physical Activity: Not on file  Stress: Not on file  Social Connections: Not on file     Review of Systems  Constitutional:  Negative for appetite change and unexpected weight change.  HENT:  Negative for congestion and sinus pressure.   Respiratory:  Negative for cough, chest tightness and shortness of breath.   Cardiovascular:  Negative for chest pain, palpitations and leg swelling.  Gastrointestinal:  Negative for abdominal pain, diarrhea, nausea and vomiting.  Genitourinary:  Negative for difficulty urinating and dysuria.  Musculoskeletal:  Negative for joint swelling and myalgias.  Skin:  Negative for color change and rash.  Neurological:  Negative for dizziness, light-headedness and headaches.  Psychiatric/Behavioral:  Negative for agitation and  dysphoric mood.        Objective:     BP 138/78 (BP Location: Left Arm, Patient Position: Sitting, Cuff Size: Large)   Pulse (!) 115   Temp 98.9 F (37.2 C) (Oral)   Ht 5\' 5"  (1.651 m)   Wt 257 lb 12.8 oz (116.9 kg)   LMP 02/02/2022   SpO2 98%   BMI 42.90 kg/m  Wt Readings from Last 3 Encounters:  03/31/22 257 lb 12.8 oz (116.9 kg)  03/13/22 250 lb (113.4 kg)  03/03/22 270 lb (122.5 kg)    Physical Exam Vitals reviewed.  Constitutional:      General: She is not in acute distress.    Appearance: Normal appearance.  HENT:     Head: Normocephalic and atraumatic.     Right Ear: External ear normal.     Left Ear: External ear normal.  Eyes:     General: No scleral icterus.       Right eye: No discharge.        Left eye: No  discharge.     Conjunctiva/sclera: Conjunctivae normal.  Neck:     Thyroid: No thyromegaly.  Cardiovascular:     Rate and Rhythm: Normal rate and regular rhythm.  Pulmonary:     Effort: No respiratory distress.     Breath sounds: Normal breath sounds. No wheezing.  Abdominal:     General: Bowel sounds are normal.     Palpations: Abdomen is soft.     Tenderness: There is no abdominal tenderness.  Musculoskeletal:        General: No swelling or tenderness.     Cervical back: Neck supple. No tenderness.  Lymphadenopathy:     Cervical: No cervical adenopathy.  Skin:    Findings: No erythema or rash.  Neurological:     Mental Status: She is alert.  Psychiatric:        Mood and Affect: Mood normal.        Behavior: Behavior normal.      Outpatient Encounter Medications as of 03/31/2022  Medication Sig   Cholecalciferol (VITAMIN D3) 3000 UNITS TABS Take by mouth.   Drospirenone (SLYND) 4 MG TABS Take 4 mg by mouth daily.   lisinopril (ZESTRIL) 10 MG tablet Take 1 tablet (10 mg total) by mouth daily.   LORATADINE ALLERGY RELIEF PO Take by mouth.   Lysine 1000 MG TABS Take by mouth.   metFORMIN (GLUCOPHAGE) 500 MG tablet Take 1 tablet (500 mg total) by mouth 2 (two) times daily with a meal.   Zinc 50 MG TABS Take by mouth.   [DISCONTINUED] lisinopril (ZESTRIL) 10 MG tablet TAKE 1 TABLET BY MOUTH EVERY DAY   [DISCONTINUED] norethindrone-ethinyl estradiol-iron (BLISOVI FE 1.5/30) 1.5-30 MG-MCG tablet Take 1 tablet by mouth daily. (Patient not taking: Reported on 03/31/2022)   No facility-administered encounter medications on file as of 03/31/2022.     Lab Results  Component Value Date   WBC 14.6 (H) 03/03/2022   HGB 14.4 03/03/2022   HCT 45.4 03/03/2022   PLT 372 03/03/2022   GLUCOSE 314 (H) 03/03/2022   CHOL 195 11/03/2017   TRIG 326.0 (H) 11/03/2017   HDL 25.00 (L) 11/03/2017   LDLDIRECT 107.0 11/03/2017   ALT 27 03/03/2022   AST 21 03/03/2022   NA 134 (L) 03/03/2022    K 3.4 (L) 03/03/2022   CL 100 03/03/2022   CREATININE 0.53 03/03/2022   BUN 6 03/03/2022   CO2 21 (L) 03/03/2022   TSH 1.301  03/03/2022   HGBA1C 10.1 (A) 03/31/2022   MICROALBUR 5.3 (H) 03/31/2022       Assessment & Plan:   Problem List Items Addressed This Visit     Essential hypertension    Blood pressure better on lisinopril.  Will need to follow metabolic panel.  Unable to have blood drawn today.  Follow pressures.       Hyperlipidemia    Low carb diet and exercise.  Follow lipid panel.       Type 2 diabetes mellitus with hyperglycemia (HCC) - Primary    Recently found to have blood sugar 300.  poc a1c 10.  Discussed low carb diet and exercise.  She had already made adjustments in her diet.  Decreased carbs.  Decreased sweet tea.  Exercising.  Desires not to do fingersticks as outlined.  Discussed CGM.  Sensor placed.  Send in readings over the next couple of weeks.  Continue diet and exercise.  Follow         Einar Pheasant, MD

## 2022-04-01 LAB — MICROALBUMIN / CREATININE URINE RATIO
Creatinine,U: 62.2 mg/dL
Microalb Creat Ratio: 8.6 mg/g (ref 0.0–30.0)
Microalb, Ur: 5.3 mg/dL — ABNORMAL HIGH (ref 0.0–1.9)

## 2022-04-02 ENCOUNTER — Telehealth: Payer: Self-pay | Admitting: Internal Medicine

## 2022-04-02 NOTE — Telephone Encounter (Signed)
I called patient and for now will stop the metformin since blood sugars are running so low. She will also start eating a bedtime snack maybe of a few peanut butter crackers etc to make sure that glucose does not consistently stay low causing alarm to alert her. She will also check glucose fasting in the morning then two hours after meals. She will keep a log & report back to [place] early next week or if needs [place] sooner she will call.

## 2022-04-02 NOTE — Telephone Encounter (Signed)
See phone message regarding this message.

## 2022-04-02 NOTE — Telephone Encounter (Signed)
She was recently evaluated in the ER and blood sugar was elevated.  They started her on metformin 500mg  bid.  She has made big changes in her diet, etc.  Have her for now, stop the metformin.  If she agreeable, I would like for her to leave the [place] on and let us monitor her blood sugars with her diet adjustment and exercise she has been doing.  Agree with checking fasting and some 2 hours after eating.  Agree with night time snack.  Have her update [place] regarding her sugar readings.  I can add back extended release metformin if needed.  Let us know if persistent problems.

## 2022-04-02 NOTE — Telephone Encounter (Signed)
I called and spoke with patient in depth. She was upset this morning due to [place] going off so many times last night. She said that she felt okay but were seeing numbers from 60 up to 71. She did drink orange juice but monitor proceeded to go off hour after hour until around 4 am she ate some animal crackers that spiked BP to 149. Pt is very emotional & does not feel that she can check her blood glucose due to her getting so light headed at sight of her own blood. Pt feels that at night he blood sugars are too low. She was wanting advice on what to eat or if she needed medication change. I did advise that she eat some peanut butter crackers or a few graham crackers before bed instead of just the almonds she ate last night. She said that she possibly wanted to explore changing the metformin to a different type & I am assuming the XR bc she was complaining that she is still dealing with stomach upset. Pt has so many questions on what/how to eat but she did say that she has an appointment with dietician in November. Not sure what to advise patient but to push through & possibly change up her night time snack routine. Pt did seemed stressed by checking glucose so much with Elenor Legato, so I did advuse if that the case then she check fasting in the morning, then two hours after meals.

## 2022-04-02 NOTE — Telephone Encounter (Signed)
Pt want someone to call her in regards to a glucose monitor she has. Pt state she can not sleep due to the alarms going off

## 2022-04-05 ENCOUNTER — Encounter: Payer: Self-pay | Admitting: Internal Medicine

## 2022-04-05 NOTE — Assessment & Plan Note (Signed)
Blood pressure better on lisinopril.  Will need to follow metabolic panel.  Unable to have blood drawn today.  Follow pressures.

## 2022-04-05 NOTE — Assessment & Plan Note (Signed)
Recently found to have blood sugar 300.  poc a1c 10.  Discussed low carb diet and exercise.  She had already made adjustments in her diet.  Decreased carbs.  Decreased sweet tea.  Exercising.  Desires not to do fingersticks as outlined.  Discussed CGM.  Sensor placed.  Send in readings over the next couple of weeks.  Continue diet and exercise.  Follow

## 2022-04-05 NOTE — Assessment & Plan Note (Signed)
Low carb diet and exercise.  Follow lipid panel.  

## 2022-04-14 ENCOUNTER — Encounter: Payer: Self-pay | Admitting: Internal Medicine

## 2022-04-15 NOTE — Telephone Encounter (Signed)
I called Alison Dixon yesterday evening and spoke to her about her diet - adjusting protein and carbs.  She is not on any medication.  Please call and confirm she is doing ok and tell her to keep [place] informed how she is doing and let us know if she needs anything.

## 2022-05-20 ENCOUNTER — Ambulatory Visit: Payer: BC Managed Care – PPO | Admitting: Dietician

## 2022-07-17 ENCOUNTER — Encounter: Payer: BC Managed Care – PPO | Attending: Internal Medicine | Admitting: Dietician

## 2022-07-17 ENCOUNTER — Encounter: Payer: Self-pay | Admitting: Dietician

## 2022-07-17 VITALS — Ht 64.0 in | Wt 269.1 lb

## 2022-07-17 DIAGNOSIS — R739 Hyperglycemia, unspecified: Secondary | ICD-10-CM

## 2022-07-17 DIAGNOSIS — I10 Essential (primary) hypertension: Secondary | ICD-10-CM | POA: Diagnosis not present

## 2022-07-17 DIAGNOSIS — Z6841 Body Mass Index (BMI) 40.0 and over, adult: Secondary | ICD-10-CM | POA: Diagnosis not present

## 2022-07-17 NOTE — Patient Instructions (Signed)
Aim for eating 3 servings of fruit daily, and 1-2 vegetables. Keep looking for other suitable vegetable options/ recipes, without pressure to eat something you don't like.  Great job controlling sugar and carbs! Keep it up! Follow through with exercise plans

## 2022-07-17 NOTE — Progress Notes (Signed)
Medical Nutrition Therapy: Visit start time: 0820  end time: 0920  Assessment:   Referral Diagnosis: essential hypertension, hyperglycemia Other medical history/ diagnoses: none significant Psychosocial issues/ stress concerns: none  Medications, supplements: reconciled list in medical record   Preferred learning method:  Auditory Visual Hands-on    Current weight: 269.1lbs Height: 5'4" BMI: 46.19 Patient's personal weight goal: 200lbs  Progress and evaluation:  Patient has stopped drinking sodas, significantly reduced intake of sweets to control BG, which has improved, and is now off Metformin. Some changes made last fall have faded over holidays  Has been out of work since early December, but starts new job next week. Maternal grandmother had diabetes, no other known fam history Food allergies: none known Special diet practices: none Patient seeks help with making healthy food choices    Dietary Intake:  Usual eating pattern includes 3 meals and 1-2 snacks per day. Dining out frequency: 3-4 meals per week. Who plans meals/ buys groceries? self Who prepares meals? Self, grandfather  Breakfast: 2 waffles + [employer]ik[employer] protein yogurt; protein bar (fit& crunch) Snack: none or same as pm Lunch: usu brought to work [place] with rice asparagus + yogurt, animal crackers; chicken wings; grilled chicken nuggets and fries;  Snack: peanuts/ almonds/ PB crackers/ pretzels/ graham crackers with PB Supper: 1/2 philly cheesesteak; turkey/ seafood + veg zucchini and onions, asparagus, corn does not like other veg + potatoes/ rice Snack: none or same as pm Beverages: water 8+ cups daily, Sparkling Ice drink; milk  Physical activity: no regular exercise currently, plans to start gym workouts with close friends next week   Intervention:   Nutrition Care Education:   Basic nutrition: basic food groups; appropriate nutrient balance; appropriate meal and snack schedule; general nutrition  guidelines    Weight control: importance of low sugar and low fat choices; healthy food portions and using hand to estimate portions; estimated energy needs for weight loss at 1500 kcal, provided guidance for 45% CHO, 25% pro, 30% fat; role os physical activity Diabetes prevention:  appropriate meal and snack schedule; appropriate carb intake and balance, healthy carb choices; role of fiber, protein, fat; physical activity Hypertension:  identifying food sources of potassium, magnesium; general heart healthy guidelines  Other intervention notes: Patient has made significant diet changes to improve BG control and blood pressure; she is motivated to continue. Established goals for additional change with direction from patient. No follow up scheduled at this time; patient will schedule later if needed.   Nutritional Diagnosis:  [place]-2.1 Inpaired nutrition utilization and [place]-2.2 Altered nutrition-related laboratory As related to hypertension and hyperglycemia.  As evidenced by BP controlled with medication, history of elevated HbA1C. -3.3 Overweight/obesity As related to history of excess calories and inadequate physical activity.  As evidenced by patient with current BMI of 46.19, working on diet and lifestyle changes to promote weight loss.   Education Materials given:  [employer] guidelines for Heart health Museum/gallery conservator with food lists, sample meal pattern Sample menus Visit summary with goals/ instructions   Learner/ who was taught:  Patient    Level of understanding: Verbalizes/ demonstrates competency  Demonstrated degree of understanding via:   Teach back Learning barriers: None  Willingness to learn/ readiness for change: Eager, change in progress   Monitoring and Evaluation:  Dietary intake, exercise, BP control, BG control, and body weight      follow up: prn

## 2022-09-04 ENCOUNTER — Telehealth: Payer: Self-pay

## 2022-09-04 ENCOUNTER — Encounter: Payer: Self-pay | Admitting: Internal Medicine

## 2022-09-04 ENCOUNTER — Encounter: Payer: Self-pay | Admitting: Nurse Practitioner

## 2022-09-04 ENCOUNTER — Telehealth: Payer: BC Managed Care – PPO | Admitting: Nurse Practitioner

## 2022-09-04 ENCOUNTER — Other Ambulatory Visit: Payer: Self-pay | Admitting: Nurse Practitioner

## 2022-09-04 VITALS — Ht 64.0 in | Wt 269.0 lb

## 2022-09-04 DIAGNOSIS — J029 Acute pharyngitis, unspecified: Secondary | ICD-10-CM

## 2022-09-04 DIAGNOSIS — J069 Acute upper respiratory infection, unspecified: Secondary | ICD-10-CM | POA: Diagnosis not present

## 2022-09-04 LAB — POC COVID19 BINAXNOW: SARS Coronavirus 2 Ag: NEGATIVE

## 2022-09-04 LAB — POCT INFLUENZA A/B
Influenza A, POC: NEGATIVE
Influenza B, POC: NEGATIVE

## 2022-09-04 LAB — POCT RAPID STREP A (OFFICE): Rapid Strep A Screen: NEGATIVE

## 2022-09-04 MED ORDER — PSEUDOEPH-BROMPHEN-DM 30-2-10 MG/5ML PO SYRP: 5.0000 mL | ORAL_SOLUTION | Freq: Four times a day (QID) | ORAL | 0 refills | Status: DC | PRN

## 2022-09-04 NOTE — Telephone Encounter (Signed)
Called pt to confirm that she was still able to come by the office to get swabbed for her appt.   Pt confirmed she was and I advised her to call the office once she pulls around back and let us know she has arrived so that I can go out and swab her.

## 2022-09-04 NOTE — Progress Notes (Signed)
MyChart Video Visit    Virtual Visit via Video Note   This visit type was conducted because this format is felt to be most appropriate for this patient at this time. Physical exam was limited by quality of the video and audio technology used for the visit. CMA was able to get the patient set up on a video visit.  Patient location: Car, parked in parking lot outside office. Patient and provider in visit Provider location: Office  I discussed the limitations of evaluation and management by telemedicine and the availability of in person appointments. The patient expressed understanding and agreed to proceed.  Visit Date: 09/04/2022  Today's healthcare provider: Tomasita Morrow, NP     Subjective:    Patient ID: Margie Billet, female    DOB: [birth year], 22 y.o.   MRN: [id number]  Chief Complaint  Patient presents with   Cough    Had body aches, nasal congestion, yesterday body felt hot to the touch.    HPI  Patient reports having lunch with a friend on Sunday who woke up sick on Monday. Patient reports her symptoms began last night. She started having body aches and felt hot, she did not have a fever when she checked her temperature. Reports the body aches have resolved today and but now she has a very sore throat.   Respiratory illness:  Cough- Yes- dry, nonproductive  Congestion-    Sinus- Yes- nasal   Chest- No  Post nasal drip- No  Sore throat- Yes- pain with swallowing  Shortness of breath- No  Fever- No  Fatigue/Myalgia- Yes Headache- Yes Nausea/Vomiting- No Taste disturbance- No  Smell disturbance- No  Covid exposure- No  Covid vaccination- Never  Flu vaccination- Needs  Medications- Mucinex, nasal spray   Past Medical History:  Diagnosis Date   Allergy    Asthma    Obesity    Prediabetes    Seizures (HCC)    Vitamin D deficiency     Past Surgical History:  Procedure Laterality Date   ADENOIDECTOMY     TONSILLECTOMY     TYMPANOSTOMY TUBE  PLACEMENT     WISDOM TOOTH EXTRACTION  2017    Family History  Problem Relation Age of Onset   Hypertension Mother    Hyperlipidemia Mother    Diabetes Maternal Grandmother    Hypertension Maternal Grandmother    Hypertension Maternal Grandfather    Heart disease Maternal Grandfather    Heart disease Paternal Grandfather    Hyperlipidemia Paternal Grandfather     Social History   Socioeconomic History   Marital status: Single    Spouse name: Not on file   Number of children: Not on file   Years of education: Not on file   Highest education level: Not on file  Occupational History   Not on file  Tobacco Use   Smoking status: Never   Smokeless tobacco: Never  Substance and Sexual Activity   Alcohol use: No    Alcohol/week: 0.0 standard drinks of alcohol   Drug use: No   Sexual activity: Not on file  Other Topics Concern   Not on file  Social History Narrative   Not on file   Social Determinants of Health   Financial Resource Strain: Not on file  Food Insecurity: Not on file  Transportation Needs: Not on file  Physical Activity: Not on file  Stress: Not on file  Social Connections: Not on file  Intimate Partner Violence: Not on file  Outpatient Medications Prior to Visit  Medication Sig Dispense Refill   Cholecalciferol (VITAMIN D3) 3000 UNITS TABS Take by mouth.     Drospirenone (SLYND) 4 MG TABS Take 4 mg by mouth daily.     lisinopril (ZESTRIL) 10 MG tablet Take 1 tablet (10 mg total) by mouth daily. 30 tablet 11   Lysine 1000 MG TABS Take by mouth.     Zinc Citrate-Phytase (ZYTAZE) 25-500 MG CAPS Take by mouth.     LORATADINE ALLERGY RELIEF PO Take by mouth. (Patient not taking: Reported on 09/04/2022)     metFORMIN (GLUCOPHAGE) 500 MG tablet Take 1 tablet (500 mg total) by mouth 2 (two) times daily with a meal. (Patient not taking: Reported on 09/04/2022) 60 tablet 11   doxycycline (VIBRAMYCIN) 100 MG capsule Take 100 mg by mouth 2 (two) times daily.      spironolactone (ALDACTONE) 50 MG tablet Take 100 mg by mouth daily.     No facility-administered medications prior to visit.    Allergies  Allergen Reactions   Albuterol Sulfate     REACTION: Had seizures   Azithromycin    Diphenhydramine Hcl     ROS See HPI     Objective:    Physical Exam  Ht 5\' 4"  (1.626 m)   Wt 269 lb (122 kg)   BMI 46.17 kg/m  Wt Readings from Last 3 Encounters:  09/04/22 269 lb (122 kg)  07/17/22 269 lb 1.6 oz (122.1 kg)  03/31/22 257 lb 12.8 oz (116.9 kg)   GENERAL: alert, oriented, appears well and in no acute distress   HEENT: atraumatic, conjunttiva clear, no obvious abnormalities on inspection of external nose and ears   NECK: normal movements of the head and neck   LUNGS: on inspection no signs of respiratory distress, breathing rate appears normal, no obvious gross SOB, gasping or wheezing   CV: no obvious cyanosis   MS: moves all visible extremities without noticeable abnormality   PSYCH/NEURO: pleasant and cooperative, no obvious depression or anxiety, speech and thought processing grossly intact     Assessment & Plan:   Problem List Items Addressed This Visit       Respiratory   Viral upper respiratory tract infection - Primary    Likely viral. Symptoms just started last night. Discussed with patient taking Bromfed DM for cough and to continue nasal spray for congestion. Advised adequate hydration and can take Tylenol/Ibuprofen for fevers/body aches. Encouraged her to reach out if her symptoms continue into next week without improvement or are worsening.       Relevant Medications   brompheniramine-pseudoephedrine-DM 30-2-10 MG/5ML syrup   Other Visit Diagnoses     Sore throat           I have discontinued Anesa J. Mies's doxycycline and spironolactone. I am also having her start on brompheniramine-pseudoephedrine-DM. Additionally, I am having her maintain her Vitamin D3, Lysine, LORATADINE ALLERGY RELIEF PO,  metFORMIN, lisinopril, Slynd, and Zytaze.  Meds ordered this encounter  Medications   brompheniramine-pseudoephedrine-DM 30-2-10 MG/5ML syrup    Sig: Take 5-10 mLs by mouth every 6 (six) hours as needed.    Dispense:  120 mL    Refill:  0    Order Specific Question:   Supervising Provider    Answer:   Caryl Bis, ERIC G [[id number]]    I discussed the assessment and treatment plan with the patient. The patient was provided an opportunity to ask questions and all were answered. The patient agreed with the  plan and demonstrated an understanding of the instructions.   The patient was advised to call back or seek an in-person evaluation if the symptoms worsen or if the condition fails to improve as anticipated.   Tomasita Morrow, NP [place] [facility] [phone] (phone) [phone] (fax)  [place]

## 2022-09-04 NOTE — Assessment & Plan Note (Signed)
Likely viral. Symptoms just started last night. Discussed with patient taking Bromfed DM for cough and to continue nasal spray for congestion. Advised adequate hydration and can take Tylenol/Ibuprofen for fevers/body aches. Encouraged her to reach out if her symptoms continue into next week without improvement or are worsening.

## 2022-09-06 ENCOUNTER — Encounter: Payer: Self-pay | Admitting: Nurse Practitioner

## 2022-09-07 NOTE — Telephone Encounter (Signed)
Based on review of Alison Dixon's note it appears the patient's symptoms started on the 14th.  Alison Dixon diagnosed her with a viral infection.  Treatment for this kind of infection is supportive to help with symptoms.  Patient's symptoms typically start to improve 5 to 7 days after symptom onset so she is still within that timeframe.  She could use Allegra or Zyrtec over-the-counter to help with symptoms.  Sometimes Flonase is also helpful.  If she is not starting to improve by the end of the week she should let us know.  If she has any worsening symptoms she should be reevaluated.

## 2022-09-09 NOTE — Telephone Encounter (Signed)
Pt called stating she is improving but she still has a cough, congestion and something in her throat

## 2022-09-10 ENCOUNTER — Other Ambulatory Visit: Payer: Self-pay | Admitting: Nurse Practitioner

## 2022-09-10 DIAGNOSIS — R051 Acute cough: Secondary | ICD-10-CM

## 2022-09-10 MED ORDER — BENZONATATE 200 MG PO CAPS: 200.0000 mg | ORAL_CAPSULE | Freq: Three times a day (TID) | ORAL | 0 refills | Status: DC | PRN

## 2022-09-14 ENCOUNTER — Telehealth: Payer: BC Managed Care – PPO | Admitting: Physician Assistant

## 2022-09-14 DIAGNOSIS — B001 Herpesviral vesicular dermatitis: Secondary | ICD-10-CM

## 2022-09-14 DIAGNOSIS — J029 Acute pharyngitis, unspecified: Secondary | ICD-10-CM | POA: Diagnosis not present

## 2022-09-14 MED ORDER — ACYCLOVIR 800 MG PO TABS: 800.0000 mg | ORAL_TABLET | Freq: Two times a day (BID) | ORAL | 0 refills | Status: AC

## 2022-09-14 NOTE — Progress Notes (Signed)
We are sorry that you are not feeling well.  Here is how we plan to help!  Based on what you have shared with me it does look like you have a viral infection.    Most cold sores or fever blisters are small fluid filled blisters around the mouth caused by herpes simplex virus.  The most common strain of the virus causing cold sores is herpes simplex virus 1.  It can be spread by skin contact, sharing eating utensils, or even sharing towels.  Cold sores are contagious to other people until dry. (Approximately 5-7 days).  Wash your hands. You can spread the virus to your eyes through handling your contact lenses after touching the lesions.  Most people experience pain at the sight or tingling sensations in their lips that may begin before the ulcers erupt.  Herpes simplex is treatable but not curable.  It may lie dormant for a long time and then reappear due to stress or prolonged sun exposure.  Many patients have success in treating their cold sores with an over the counter topical called Abreva.  You may apply the cream up to 5 times daily (maximum 10 days) until healing occurs.  If you would like to use an oral antiviral medication to speed the healing of your cold sore, I have sent a prescription to your local pharmacy Acyclovir 800 mg take one by mouth twice a day for 7 days    HOME CARE:  Wash your hands frequently. Do not pick at or rub the sore. Don't open the blisters. Avoid kissing other people during this time. Avoid sharing drinking glasses, eating utensils, or razors. Do not handle contact lenses unless you have thoroughly washed your hands with soap and warm water! Avoid oral sex during this time.  Herpes from sores on your mouth can spread to your partner's genital area. Avoid contact with anyone who has eczema or a weakened immune system. Cold sores are often triggered by exposure to intense sunlight, use a lip balm containing a sunscreen (SPF 30 or higher).  GET HELP RIGHT AWAY  IF:  Blisters look infected. Blisters occur near or in the eye. Symptoms last longer than 10 days. Your symptoms become worse.  MAKE SURE YOU:  Understand these instructions. Will watch your condition. Will get help right away if you are not doing well or get worse.    Your e-visit answers were reviewed by a board certified advanced clinical practitioner to complete your personal care plan.  Depending upon the condition, your plan could have  Included both over the counter or prescription medications.    Please review your pharmacy choice.  Be sure that the pharmacy you have chosen is open so that you can pick up your prescription now.  If there is a problem you can message your provider in MyChart to have the prescription routed to another pharmacy.    Your safety is important to us.  If you have drug allergies check our prescription carefully.  For the next 24 hours you can use MyChart to ask questions about today's visit, request a non-urgent call back, or ask for a work or school excuse from your e-visit provider.  You will get an email in the next two days asking about your experience.  I hope that your e-visit has been valuable and will speed your recovery.  I have spent 5 minutes in review of e-visit questionnaire, review and updating patient chart, medical decision making and response to patient.     Ilhan Debenedetto M Garlen Reinig, PA-C  

## 2022-09-14 NOTE — Progress Notes (Signed)
E-Visit for Sore Throat  We are sorry that you are not feeling well.  Here is how we plan to help!  Your symptoms indicate a likely viral infection (Pharyngitis).   Pharyngitis is inflammation in the back of the throat which can cause a sore throat, scratchiness and sometimes difficulty swallowing.   Pharyngitis is typically caused by a respiratory virus and will just run its course.  Please keep in mind that your symptoms could last up to 10 days.  For throat pain, we recommend over the counter oral pain relief medications such as acetaminophen or aspirin, or anti-inflammatory medications such as ibuprofen or naproxen sodium.  Topical treatments such as oral throat lozenges or sprays may be used as needed.  Avoid close contact with loved ones, especially the very young and elderly.  Remember to wash your hands thoroughly throughout the day as this is the number one way to prevent the spread of infection and wipe down door knobs and counters with disinfectant.  After careful review of your answers, I would not recommend an antibiotic for your condition.  Antibiotics should not be used to treat conditions that we suspect are caused by viruses like the virus that causes the common cold or flu. However, some people can have Strep with atypical symptoms. You may need formal testing in clinic or office to confirm if your symptoms continue or worsen.  Providers prescribe antibiotics to treat infections caused by bacteria. Antibiotics are very powerful in treating bacterial infections when they are used properly.  To maintain their effectiveness, they should be used only when necessary.  Overuse of antibiotics has resulted in the development of super bugs that are resistant to treatment!    Home Care: Only take medications as instructed by your medical team. Do not drink alcohol while taking these medications. A steam or ultrasonic humidifier can help congestion.  You can place a towel over your head and  breathe in the steam from hot water coming from a faucet. Avoid close contacts especially the very young and the elderly. Cover your mouth when you cough or sneeze. Always remember to wash your hands.  Get Help Right Away If: You develop worsening fever or throat pain. You develop a severe head ache or visual changes. Your symptoms persist after you have completed your treatment plan.  Make sure you Understand these instructions. Will watch your condition. Will get help right away if you are not doing well or get worse.   Thank you for choosing an e-visit.  Your e-visit answers were reviewed by a board certified advanced clinical practitioner to complete your personal care plan. Depending upon the condition, your plan could have included both over the counter or prescription medications.  Please review your pharmacy choice. Make sure the pharmacy is open so you can pick up prescription now. If there is a problem, you may contact your provider through MyChart messaging and have the prescription routed to another pharmacy.  Your safety is important to us. If you have drug allergies check your prescription carefully.   For the next 24 hours you can use MyChart to ask questions about today's visit, request a non-urgent call back, or ask for a work or school excuse. You will get an email in the next two days asking about your experience. I hope that your e-visit has been valuable and will speed your recovery.  I have spent 5 minutes in review of e-visit questionnaire, review and updating patient chart, medical decision making and response   to patient.   Kyleeann Cremeans M Angelle Isais, PA-C  

## 2022-12-11 ENCOUNTER — Telehealth: Payer: Self-pay | Admitting: Internal Medicine

## 2022-12-11 MED ORDER — SCOPOLAMINE 1 MG/3DAYS TD PT72: 1.0000 | MEDICATED_PATCH | TRANSDERMAL | 0 refills | Status: DC

## 2022-12-11 NOTE — Addendum Note (Signed)
Addended by: Charm Barges on: 12/11/2022 10:58 PM   Modules accepted: Orders

## 2022-12-11 NOTE — Telephone Encounter (Signed)
Pt called stating she would like sea patches for the back of her ear because she is going on a cruise [place]

## 2022-12-11 NOTE — Telephone Encounter (Signed)
I am ok to refill, but need to clarify a few things first.  Has she taken previously?  Confirm no concern regarding pregnancy.  Also, I have not seen her since 02/2022.  Please schedule a physical/follow up appt.

## 2022-12-11 NOTE — Telephone Encounter (Signed)
Rx sent in for scopolamine patches.   ?

## 2022-12-11 NOTE — Telephone Encounter (Signed)
Pt has never taken these before but no concerns of pregnancy. Pt also agreed to schedule an appt/physical soon.  She would like patches sent to [facility] & wants to make sure she has enough for the entire cruise (7/1-7/14). I also instructed pt to be sure to wash her hands once she has applied the patch and not to touch her eyes.

## 2023-01-12 ENCOUNTER — Other Ambulatory Visit: Payer: Self-pay | Admitting: Internal Medicine

## 2023-01-13 NOTE — Telephone Encounter (Signed)
Prescription Request  01/13/2023  LOV: 03/31/2022  What is the name of the medication or equipment? lisinopril (ZESTRIL) 10 MG tablet  Have you contacted your pharmacy to request a refill? Yes   Which pharmacy would you like this sent to?   [id number] [place], [place] - [address] AT [facility] OF [address] [place] [address] Phone: [phone] Fax: [phone]    Patient notified that their request is being sent to the clinical staff for review and that they should receive a response within 2 business days.   Please advise at Mobile [phone] (mobile)

## 2023-01-14 NOTE — Telephone Encounter (Signed)
I have not seen her since 03/2022.  She has not had labs in almost one year.  She needs a f/u appt, needs labs for me to continue to prescribe.  Need to confirm if she has been taking this regularly.  Any concern regarding pregnancy?  If taking regularly and no concern regarding pregnancy, can schedule an appt and refill until appt. Needs labs prior to appt.

## 2023-01-14 NOTE — Telephone Encounter (Signed)
Pt requesting refill. Prescribed by ED provider. Tried to make pt appt with with you, patient stated she was unable to take off work right now and would have to call back. OK to refill?

## 2023-01-14 NOTE — Telephone Encounter (Signed)
See note,  needs appt - see full note regarding refill.

## 2023-01-15 ENCOUNTER — Other Ambulatory Visit: Payer: Self-pay

## 2023-01-15 MED ORDER — LISINOPRIL 10 MG PO TABS: 10.0000 mg | ORAL_TABLET | Freq: Every day | ORAL | 1 refills | Status: DC

## 2023-01-15 NOTE — Telephone Encounter (Signed)
Medication refilled

## 2023-01-15 NOTE — Telephone Encounter (Signed)
yes

## 2023-01-15 NOTE — Telephone Encounter (Signed)
Spoke with patient. She agreed to make an appointment but has to have 7am. Would prefer to do labs while she is here because it is hard for her to get off work. No concerns of pregnancy and has been taking medication regularly. Scheduled for 9/19. Ok to refill until her appt?

## 2023-02-04 ENCOUNTER — Encounter (INDEPENDENT_AMBULATORY_CARE_PROVIDER_SITE_OTHER): Payer: Self-pay

## 2023-03-11 ENCOUNTER — Ambulatory Visit: Payer: BC Managed Care – PPO | Admitting: Internal Medicine

## 2023-03-11 ENCOUNTER — Encounter: Payer: Self-pay | Admitting: Internal Medicine

## 2023-03-11 VITALS — BP 132/82 | HR 92 | Temp 98.0°F | Resp 16 | Ht 64.0 in | Wt 243.0 lb

## 2023-03-11 DIAGNOSIS — E282 Polycystic ovarian syndrome: Secondary | ICD-10-CM

## 2023-03-11 DIAGNOSIS — E785 Hyperlipidemia, unspecified: Secondary | ICD-10-CM

## 2023-03-11 DIAGNOSIS — R739 Hyperglycemia, unspecified: Secondary | ICD-10-CM

## 2023-03-11 DIAGNOSIS — I10 Essential (primary) hypertension: Secondary | ICD-10-CM | POA: Diagnosis not present

## 2023-03-11 DIAGNOSIS — E1165 Type 2 diabetes mellitus with hyperglycemia: Secondary | ICD-10-CM | POA: Diagnosis not present

## 2023-03-11 DIAGNOSIS — Z6841 Body Mass Index (BMI) 40.0 and over, adult: Secondary | ICD-10-CM

## 2023-03-11 LAB — HEPATIC FUNCTION PANEL
ALT: 25 U/L (ref 0–35)
AST: 19 U/L (ref 0–37)
Albumin: 4.3 g/dL (ref 3.5–5.2)
Alkaline Phosphatase: 91 U/L (ref 39–117)
Bilirubin, Direct: 0.1 mg/dL (ref 0.0–0.3)
Total Bilirubin: 0.6 mg/dL (ref 0.2–1.2)
Total Protein: 7.8 g/dL (ref 6.0–8.3)

## 2023-03-11 LAB — MICROALBUMIN / CREATININE URINE RATIO
Creatinine,U: 96 mg/dL
Microalb Creat Ratio: 7.3 mg/g (ref 0.0–30.0)
Microalb, Ur: 7 mg/dL — ABNORMAL HIGH (ref 0.0–1.9)

## 2023-03-11 LAB — CBC WITH DIFFERENTIAL/PLATELET
Basophils Absolute: 0.1 10*3/uL (ref 0.0–0.1)
Basophils Relative: 1 % (ref 0.0–3.0)
Eosinophils Absolute: 0.2 10*3/uL (ref 0.0–0.7)
Eosinophils Relative: 2.3 % (ref 0.0–5.0)
HCT: 41.2 % (ref 36.0–46.0)
Hemoglobin: 13.4 g/dL (ref 12.0–15.0)
Lymphocytes Relative: 26.1 % (ref 12.0–46.0)
Lymphs Abs: 2.5 10*3/uL (ref 0.7–4.0)
MCHC: 32.6 g/dL (ref 30.0–36.0)
MCV: 77.8 fl — ABNORMAL LOW (ref 78.0–100.0)
Monocytes Absolute: 0.6 10*3/uL (ref 0.1–1.0)
Monocytes Relative: 6.5 % (ref 3.0–12.0)
Neutro Abs: 6 10*3/uL (ref 1.4–7.7)
Neutrophils Relative %: 64.1 % (ref 43.0–77.0)
Platelets: 402 10*3/uL — ABNORMAL HIGH (ref 150.0–400.0)
RBC: 5.29 Mil/uL — ABNORMAL HIGH (ref 3.87–5.11)
RDW: 15.2 % (ref 11.5–15.5)
WBC: 9.4 10*3/uL (ref 4.0–10.5)

## 2023-03-11 LAB — BASIC METABOLIC PANEL
BUN: 9 mg/dL (ref 6–23)
CO2: 20 mEq/L (ref 19–32)
Calcium: 9.7 mg/dL (ref 8.4–10.5)
Chloride: 99 mEq/L (ref 96–112)
Creatinine, Ser: 0.63 mg/dL (ref 0.40–1.20)
GFR: 124.06 mL/min (ref >60.00)
Glucose, Bld: 342 mg/dL — ABNORMAL HIGH (ref 70–99)
Potassium: 3.9 mEq/L (ref 3.5–5.1)
Sodium: 132 mEq/L — ABNORMAL LOW (ref 135–145)

## 2023-03-11 LAB — LIPID PANEL
Cholesterol: 187 mg/dL (ref 0–200)
HDL: 40.3 mg/dL (ref >39.00)
LDL Cholesterol: 119 mg/dL — ABNORMAL HIGH (ref 0–99)
NonHDL: 146.43
Total CHOL/HDL Ratio: 5
Triglycerides: 137 mg/dL (ref 0.0–149.0)
VLDL: 27.4 mg/dL (ref 0.0–40.0)

## 2023-03-11 LAB — TSH: TSH: 0.85 u[IU]/mL (ref 0.35–5.50)

## 2023-03-11 LAB — HM DIABETES FOOT EXAM

## 2023-03-11 LAB — HEMOGLOBIN A1C: Hgb A1c MFr Bld: 13.6 % — ABNORMAL HIGH (ref 4.6–6.5)

## 2023-03-11 NOTE — Assessment & Plan Note (Signed)
Discussed diet and exercise.  Has adjusted diet.  Lost weight. Follow.

## 2023-03-11 NOTE — Progress Notes (Addendum)
Subjective:    Patient ID: Alison Dixon, female    DOB: 2000-08-15, 22 y.o.   MRN: [id number]  Patient here for  Chief Complaint  Patient presents with   Medical Management of Chronic Issues    HPI Here today for follow up regarding hypertension and diabetes. I have not seen her since 03/31/22. New diagnosis of diabetes last year.  She has adjusted her diet.  Cut out sweet tea.  Monitoring carb intake.  No chest pain or sob reported.  No increased heart rate or palpitations.  Anxious this am.  Drove over by herself.  No abdominal pain or bowel change reported.  Doing well on her current birth control.  No period since starting.  Sees gyn.  No headaches.  No menstrual cramps. Mood is better.  Work is going well. Stress better.    Past Medical History:  Diagnosis Date   Allergy    Asthma    Obesity    Prediabetes    Seizures (HCC)    Vitamin D deficiency    Past Surgical History:  Procedure Laterality Date   ADENOIDECTOMY     TONSILLECTOMY     TYMPANOSTOMY TUBE PLACEMENT     WISDOM TOOTH EXTRACTION  2017   Family History  Problem Relation Age of Onset   Hypertension Mother    Hyperlipidemia Mother    Diabetes Maternal Grandmother    Hypertension Maternal Grandmother    Hypertension Maternal Grandfather    Heart disease Maternal Grandfather    Heart disease Paternal Grandfather    Hyperlipidemia Paternal Grandfather    Social History   Socioeconomic History   Marital status: Single    Spouse name: Not on file   Number of children: Not on file   Years of education: Not on file   Highest education level: Bachelor's degree (e.g., BA, AB, BS)  Occupational History   Not on file  Tobacco Use   Smoking status: Never   Smokeless tobacco: Never  Substance and Sexual Activity   Alcohol use: No    Alcohol/week: 0.0 standard drinks of alcohol   Drug use: No   Sexual activity: Not on file  Other Topics Concern   Not on file  Social History Narrative   Not on file    Social Determinants of Health   Financial Resource Strain: Low Risk  (03/10/2023)   Overall Financial Resource Strain (CARDIA)    Difficulty of Paying Living Expenses: Not hard at all  Food Insecurity: No Food Insecurity (03/10/2023)   Hunger Vital Sign    Worried About Running Out of Food in the Last Year: Never true    Ran Out of Food in the Last Year: Never true  Transportation Needs: No Transportation Needs (03/10/2023)   PRAPARE - Administrator, Civil Service (Medical): No    Lack of Transportation (Non-Medical): No  Physical Activity: Insufficiently Active (03/10/2023)   Exercise Vital Sign    Days of Exercise per Week: 1 day    Minutes of Exercise per Session: 10 min  Stress: No Stress Concern Present (03/10/2023)   [employer] of Occupational Health - Occupational Stress Questionnaire    Feeling of Stress : Not at all  Social Connections: Moderately Isolated (03/10/2023)   Social Connection and Isolation Panel [NHANES]    Frequency of Communication with Friends and Family: More than three times a week    Frequency of Social Gatherings with Friends and Family: Three times a week  Attends Religious Services: More than 4 times per year    Active Member of Clubs or Organizations: No    Attends Banker Meetings: Not on file    Marital Status: Never married     Review of Systems  Constitutional:  Negative for appetite change and unexpected weight change.  HENT:  Negative for congestion and sinus pressure.   Respiratory:  Negative for cough, chest tightness and shortness of breath.   Cardiovascular:  Negative for chest pain, palpitations and leg swelling.  Gastrointestinal:  Negative for abdominal pain, diarrhea, nausea and vomiting.  Genitourinary:  Negative for difficulty urinating and dysuria.  Musculoskeletal:  Negative for joint swelling and myalgias.  Skin:  Negative for color change and rash.  Neurological:  Negative for dizziness and  headaches.  Psychiatric/Behavioral:  Negative for agitation and dysphoric mood.        Objective:     BP 132/82   Pulse 92   Temp 98 F (36.7 C)   Resp 16   Ht 5\' 4"  (1.626 m)   Wt 243 lb (110.2 kg)   SpO2 99%   BMI 41.71 kg/m  Wt Readings from Last 3 Encounters:  03/11/23 243 lb (110.2 kg)  09/04/22 269 lb (122 kg)  07/17/22 269 lb 1.6 oz (122.1 kg)    Physical Exam Vitals reviewed.  Constitutional:      General: She is not in acute distress.    Appearance: Normal appearance.  HENT:     Head: Normocephalic and atraumatic.     Right Ear: External ear normal.     Left Ear: External ear normal.  Eyes:     General: No scleral icterus.       Right eye: No discharge.        Left eye: No discharge.     Conjunctiva/sclera: Conjunctivae normal.  Neck:     Thyroid: No thyromegaly.  Cardiovascular:     Rate and Rhythm: Normal rate and regular rhythm.  Pulmonary:     Effort: No respiratory distress.     Breath sounds: Normal breath sounds. No wheezing.  Abdominal:     General: Bowel sounds are normal.     Palpations: Abdomen is soft.     Tenderness: There is no abdominal tenderness.  Musculoskeletal:        General: No swelling or tenderness.     Cervical back: Neck supple. No tenderness.  Lymphadenopathy:     Cervical: No cervical adenopathy.  Skin:    Findings: No erythema or rash.  Neurological:     Mental Status: She is alert.  Psychiatric:        Mood and Affect: Mood normal.        Behavior: Behavior normal.      Outpatient Encounter Medications as of 03/11/2023  Medication Sig   Cholecalciferol (VITAMIN D3) 3000 UNITS TABS Take by mouth.   Drospirenone (SLYND) 4 MG TABS Take 4 mg by mouth daily.   lisinopril (ZESTRIL) 10 MG tablet Take 1 tablet (10 mg total) by mouth daily.   LORATADINE ALLERGY RELIEF PO Take by mouth. (Patient not taking: Reported on 09/04/2022)   Lysine 1000 MG TABS Take by mouth.   Zinc Citrate-Phytase (ZYTAZE) 25-500 MG CAPS Take  by mouth.   [DISCONTINUED] benzonatate (TESSALON) 200 MG capsule Take 1 capsule (200 mg total) by mouth 3 (three) times daily as needed for cough.   [DISCONTINUED] brompheniramine-pseudoephedrine-DM 30-2-10 MG/5ML syrup Take 5-10 mLs by mouth every 6 (six) hours as  needed.   [DISCONTINUED] metFORMIN (GLUCOPHAGE) 500 MG tablet Take 1 tablet (500 mg total) by mouth 2 (two) times daily with a meal. (Patient not taking: Reported on 09/04/2022)   [DISCONTINUED] scopolamine (TRANSDERM-SCOP) 1 MG/3DAYS Place 1 patch (1.5 mg total) onto the skin every 3 (three) days.   No facility-administered encounter medications on file as of 03/11/2023.     Lab Results  Component Value Date   WBC 14.6 (H) 03/03/2022   HGB 14.4 03/03/2022   HCT 45.4 03/03/2022   PLT 372 03/03/2022   GLUCOSE 314 (H) 03/03/2022   CHOL 195 11/03/2017   TRIG 326.0 (H) 11/03/2017   HDL 25.00 (L) 11/03/2017   LDLDIRECT 107.0 11/03/2017   ALT 27 03/03/2022   AST 21 03/03/2022   NA 134 (L) 03/03/2022   K 3.4 (L) 03/03/2022   CL 100 03/03/2022   CREATININE 0.53 03/03/2022   BUN 6 03/03/2022   CO2 21 (L) 03/03/2022   TSH 1.301 03/03/2022   HGBA1C 10.1 (A) 03/31/2022   MICROALBUR 5.3 (H) 03/31/2022    No results found.     Assessment & Plan:  Hyperglycemia -     Microalbumin / creatinine urine ratio  Hyperlipidemia, unspecified hyperlipidemia type Assessment & Plan: Low carb diet and exercise.  Follow lipid panel.   Orders: -     CBC with Differential/Platelet -     TSH -     Basic metabolic panel -     Hepatic function panel -     Lipid panel  Type 2 diabetes mellitus with hyperglycemia, without long-term current use of insulin (HCC) Assessment & Plan: Recent diagnosis of diabetes.  Has adjusted diet.  Lost weight.  Not checking sugars.  On no medication.  Continue diet adjust.  Follow met b and A1c.   Orders: -     Hemoglobin A1c  BMI 40.0-44.9, adult [facility]) Assessment & Plan: Discussed diet and exercise.   Has adjusted diet.  Lost weight. Follow.     Essential hypertension Assessment & Plan: Blood pressure better on lisinopril.  Will need to follow metabolic panel.  Check met b. Today. Follow pressures.    PCOS (polycystic ovarian syndrome) Assessment & Plan: On ocp's.  Seeing gyn. Doing better.  No period since being on ocps.  Follow.       Dale Washita, MD

## 2023-03-11 NOTE — Assessment & Plan Note (Signed)
On ocp's.  Seeing gyn. Doing better.  No period since being on ocps.  Follow.

## 2023-03-11 NOTE — Assessment & Plan Note (Signed)
Recent diagnosis of diabetes.  Has adjusted diet.  Lost weight.  Not checking sugars.  On no medication.  Continue diet adjust.  Follow met b and A1c.

## 2023-03-11 NOTE — Assessment & Plan Note (Signed)
Low carb diet and exercise.  Follow lipid panel.

## 2023-03-11 NOTE — Assessment & Plan Note (Signed)
Blood pressure better on lisinopril.  Will need to follow metabolic panel.  Check met b. Today. Follow pressures.

## 2023-03-14 ENCOUNTER — Other Ambulatory Visit: Payer: Self-pay | Admitting: Internal Medicine

## 2023-03-24 ENCOUNTER — Telehealth: Payer: Self-pay

## 2023-03-24 NOTE — Telephone Encounter (Signed)
-----   Message from Bradley sent at 03/22/2023  9:12 PM EDT ----- Given the degree of elevation, she needs an appt to discuss. Please schedule an appt with me.

## 2023-03-24 NOTE — Telephone Encounter (Signed)
Pt needs appt with Dr Lorin Picket to discuss diabetes and significantly elevated A1C.

## 2023-04-01 ENCOUNTER — Telehealth: Payer: Self-pay

## 2023-04-01 NOTE — Telephone Encounter (Signed)
Lvm for pt to call and schedule an appt w/ Dr. Lorin Picket

## 2023-04-01 NOTE — Telephone Encounter (Signed)
-----   Message from Bradley sent at 03/22/2023  9:12 PM EDT ----- Given the degree of elevation, she needs an appt to discuss. Please schedule an appt with me.

## 2023-04-05 NOTE — Telephone Encounter (Signed)
Patient just called back and she said she has an appointment in January. Her number is [phone].

## 2023-04-05 NOTE — Telephone Encounter (Signed)
Lvm for pt. Pt needs a sooner appt to discuss lab results

## 2023-04-05 NOTE — Telephone Encounter (Signed)
Given the level of her A1c, she needs to be on insulin.  Really need to see her soon, in person - to be able to show her how to administer insulin, etc.  Also, is she checking her sugars.  Need to know how they are running.  Continue low carb diet and exercise.

## 2023-07-12 ENCOUNTER — Ambulatory Visit: Payer: BC Managed Care – PPO | Admitting: Internal Medicine

## 2024-04-12 ENCOUNTER — Other Ambulatory Visit: Payer: Self-pay | Admitting: Internal Medicine
